# Patient Record
Sex: Male | Born: 1970 | Race: White | Hispanic: No | Marital: Single | Smoking: Current some day smoker
Health system: Southern US, Community
[De-identification: ages and names within clinical notes are randomized; demographics above are authoritative.]

## PROBLEM LIST (undated history)

## (undated) DIAGNOSIS — E7800 Pure hypercholesterolemia, unspecified: Secondary | ICD-10-CM

## (undated) DIAGNOSIS — F419 Anxiety disorder, unspecified: Secondary | ICD-10-CM

## (undated) DIAGNOSIS — E119 Type 2 diabetes mellitus without complications: Secondary | ICD-10-CM

---

## 2019-06-06 DIAGNOSIS — Z23 Encounter for immunization: Secondary | ICD-10-CM | POA: Diagnosis not present

## 2019-07-05 DIAGNOSIS — Z23 Encounter for immunization: Secondary | ICD-10-CM | POA: Diagnosis not present

## 2019-09-02 DIAGNOSIS — M9902 Segmental and somatic dysfunction of thoracic region: Secondary | ICD-10-CM | POA: Diagnosis not present

## 2019-09-02 DIAGNOSIS — M546 Pain in thoracic spine: Secondary | ICD-10-CM | POA: Diagnosis not present

## 2019-09-02 DIAGNOSIS — M5413 Radiculopathy, cervicothoracic region: Secondary | ICD-10-CM | POA: Diagnosis not present

## 2019-09-02 DIAGNOSIS — M9901 Segmental and somatic dysfunction of cervical region: Secondary | ICD-10-CM | POA: Diagnosis not present

## 2019-10-23 DIAGNOSIS — E782 Mixed hyperlipidemia: Secondary | ICD-10-CM | POA: Diagnosis not present

## 2019-10-23 DIAGNOSIS — E118 Type 2 diabetes mellitus with unspecified complications: Secondary | ICD-10-CM | POA: Diagnosis not present

## 2019-10-23 DIAGNOSIS — F411 Generalized anxiety disorder: Secondary | ICD-10-CM | POA: Diagnosis not present

## 2019-10-23 DIAGNOSIS — Z0189 Encounter for other specified special examinations: Secondary | ICD-10-CM | POA: Diagnosis not present

## 2019-12-13 DIAGNOSIS — R7301 Impaired fasting glucose: Secondary | ICD-10-CM | POA: Diagnosis not present

## 2019-12-13 DIAGNOSIS — Z125 Encounter for screening for malignant neoplasm of prostate: Secondary | ICD-10-CM | POA: Diagnosis not present

## 2019-12-13 DIAGNOSIS — Z1329 Encounter for screening for other suspected endocrine disorder: Secondary | ICD-10-CM | POA: Diagnosis not present

## 2019-12-13 DIAGNOSIS — Z0000 Encounter for general adult medical examination without abnormal findings: Secondary | ICD-10-CM | POA: Diagnosis not present

## 2019-12-19 DIAGNOSIS — Z0001 Encounter for general adult medical examination with abnormal findings: Secondary | ICD-10-CM | POA: Diagnosis not present

## 2019-12-19 DIAGNOSIS — E782 Mixed hyperlipidemia: Secondary | ICD-10-CM | POA: Diagnosis not present

## 2019-12-19 DIAGNOSIS — F411 Generalized anxiety disorder: Secondary | ICD-10-CM | POA: Diagnosis not present

## 2019-12-19 DIAGNOSIS — E118 Type 2 diabetes mellitus with unspecified complications: Secondary | ICD-10-CM | POA: Diagnosis not present

## 2020-01-28 ENCOUNTER — Other Ambulatory Visit: Payer: Self-pay

## 2020-01-28 ENCOUNTER — Other Ambulatory Visit: Payer: Self-pay | Admitting: *Deleted

## 2020-01-28 DIAGNOSIS — Z20822 Contact with and (suspected) exposure to covid-19: Secondary | ICD-10-CM

## 2020-01-30 LAB — SPECIMEN STATUS REPORT

## 2020-01-30 LAB — NOVEL CORONAVIRUS, NAA: SARS-CoV-2, NAA: NOT DETECTED

## 2020-01-30 LAB — SARS-COV-2, NAA 2 DAY TAT

## 2020-10-06 DIAGNOSIS — Z206 Contact with and (suspected) exposure to human immunodeficiency virus [HIV]: Secondary | ICD-10-CM | POA: Diagnosis not present

## 2020-10-06 DIAGNOSIS — Z202 Contact with and (suspected) exposure to infections with a predominantly sexual mode of transmission: Secondary | ICD-10-CM | POA: Diagnosis not present

## 2020-10-06 DIAGNOSIS — Z114 Encounter for screening for human immunodeficiency virus [HIV]: Secondary | ICD-10-CM | POA: Diagnosis not present

## 2021-03-06 ENCOUNTER — Other Ambulatory Visit: Payer: Self-pay

## 2021-03-06 ENCOUNTER — Ambulatory Visit
Admission: EM | Admit: 2021-03-06 | Discharge: 2021-03-06 | Disposition: A | Discharge status: Home / Self Care | Payer: 59

## 2021-03-06 DIAGNOSIS — M778 Other enthesopathies, not elsewhere classified: Secondary | ICD-10-CM | POA: Diagnosis not present

## 2021-03-06 DIAGNOSIS — L02412 Cutaneous abscess of left axilla: Secondary | ICD-10-CM

## 2021-03-06 HISTORY — DX: Pure hypercholesterolemia, unspecified: E78.00

## 2021-03-06 HISTORY — DX: Anxiety disorder, unspecified: F41.9

## 2021-03-06 HISTORY — DX: Type 2 diabetes mellitus without complications: E11.9

## 2021-03-06 MED ORDER — KETOROLAC TROMETHAMINE 60 MG/2ML IM SOLN
60.0000 mg | Freq: Once | INTRAMUSCULAR | Status: AC
  Administered 2021-03-06: 60 mg via INTRAMUSCULAR

## 2021-03-06 MED ORDER — MUPIROCIN 2 % EX OINT: 1.0000 "application " | TOPICAL_OINTMENT | Freq: Two times a day (BID) | CUTANEOUS | 0 refills | Status: DC

## 2021-03-06 MED ORDER — SULFAMETHOXAZOLE-TRIMETHOPRIM 800-160 MG PO TABS: 1.0000 | ORAL_TABLET | Freq: Two times a day (BID) | ORAL | 0 refills | Status: DC

## 2021-03-06 MED ORDER — HIBICLENS 4 % EX LIQD: Freq: Every day | CUTANEOUS | 0 refills | Status: DC | PRN

## 2021-03-06 NOTE — ED Triage Notes (Signed)
Pt presents with abscess under left arm x1 week and rt elbow pain that radiates to his wrist

## 2021-03-06 NOTE — ED Provider Notes (Signed)
[facility]    CSN: [id number] Arrival date & time: 03/06/21  0836      History   Chief Complaint Chief Complaint  Patient presents with   Abscess    HPI James Brock is a 50 y.o. male.   Patient presenting today with 1 week history of an itchy bug bite type area under left arm that he has been scratching and he noticed the last few days it has become red, swollen, very painful and draining.  He denies fever, chills, body aches.  Has not taken anything over-the-counter for symptoms thus far.  He is also having right lateral elbow pain with certain movements that radiates down into his forearm.  He has a history of tennis elbow and states this feels similar.  He denies any obvious injury, swelling, redness, numbness, tingling.  Not tried anything for this either.  Past Medical History:  Diagnosis Date   Anxiety    Diabetes mellitus without complication (HCC)    High cholesterol     There are no problems to display for this patient.   History reviewed. No pertinent surgical history.   Home Medications    Prior to Admission medications   Medication Sig Start Date End Date Taking? Authorizing Provider  ALPRAZolam Prudy Feeler) 0.5 MG tablet Take 0.5 mg by mouth at bedtime as needed for anxiety.   Yes [provider]  buPROPion (WELLBUTRIN XL) 150 MG 24 hr tablet Take 150 mg by mouth daily.   Yes [provider]  chlorhexidine (HIBICLENS) 4 % external liquid Apply topically daily as needed. 03/06/21  Yes Particia Nearing, PA-C  GLYBURIDE-METFORMIN PO Take by mouth.   Yes [provider]  mupirocin ointment (BACTROBAN) 2 % Apply 1 application topically 2 (two) times daily. 03/06/21  Yes Particia Nearing, PA-C  sulfamethoxazole-trimethoprim (BACTRIM DS) 800-160 MG tablet Take 1 tablet by mouth 2 (two) times daily. 03/06/21  Yes Particia Nearing, PA-C   Family History History reviewed. No pertinent family history.  Social  History Social History   Tobacco Use   Smoking status: Never   Smokeless tobacco: Never    Allergies   Patient has no known allergies.  Review of Systems Review of Systems PER HPI   Physical Exam Triage Vital Signs ED Triage Vitals  Enc Vitals Group     BP 03/06/21 0912 (!) 145/83     Pulse Rate 03/06/21 0912 70     Resp 03/06/21 0912 14     Temp 03/06/21 0912 98.6 F (37 C)     Temp Source 03/06/21 0912 Oral     SpO2 03/06/21 0912 95 %     Weight --      Height --      Head Circumference --      Peak Flow --      Pain Score 03/06/21 0913 6     Pain Loc --      Pain Edu? --      Excl. in GC? --    No data found.  Updated Vital Signs BP (!) 145/83 (BP Location: Right Arm)    Pulse 70    Temp 98.6 F (37 C) (Oral)    Resp 14    SpO2 95%   Visual Acuity Right Eye Distance:   Left Eye Distance:   Bilateral Distance:    Right Eye Near:   Left Eye Near:    Bilateral Near:     Physical Exam Vitals and nursing note  reviewed.  Constitutional:      Appearance: Normal appearance.  HENT:     Head: Atraumatic.     Mouth/Throat:     Mouth: Mucous membranes are moist.  Eyes:     Extraocular Movements: Extraocular movements intact.     Conjunctiva/sclera: Conjunctivae normal.  Cardiovascular:     Rate and Rhythm: Normal rate and regular rhythm.  Pulmonary:     Effort: Pulmonary effort is normal.     Breath sounds: Normal breath sounds.  Musculoskeletal:        General: Tenderness present. No swelling, deformity or signs of injury. Normal range of motion.     Cervical back: Normal range of motion and neck supple.     Comments: Tender to palpation over lateral right elbow, worse with twisting of the forearm and gripping motion  Skin:    General: Skin is warm.     Comments: Erythematous, edematous abscess with scabbing and active drainage present in left axilla.  Significantly tender to palpation.  Neurological:     General: No focal deficit present.      Mental Status: He is oriented to person, place, and time.     Motor: No weakness.     Gait: Gait normal.  Psychiatric:        Mood and Affect: Mood normal.        Thought Content: Thought content normal.        Judgment: Judgment normal.     UC Treatments / Results  Labs (all labs ordered are listed, but only abnormal results are displayed) Labs Reviewed - No data to display  EKG   Radiology No results found.  Procedures Procedures (including critical care time)  Medications Ordered in UC Medications  ketorolac (TORADOL) injection 60 mg (60 mg Intramuscular Given 03/06/21 1052)    Initial Impression / Assessment and Plan / UC Course  I have reviewed the triage vital signs and the nursing notes.  Pertinent labs & imaging results that were available during my care of the patient were reviewed by me and considered in my medical decision making (see chart for details).     Patient declines I&D procedure to further drain the abscess as it is already spontaneously draining in some areas and requesting warm compresses, antibiotics, Hibiclens, mupirocin instead to see if this resolves the issue.  We will treat with IM Toradol for pain from this area as well as for tendinitis of right elbow.  Discussed over-the-counter supportive medications, home care, tennis elbow bracing and rest.  Return for acutely worsening symptoms.  Final Clinical Impressions(s) / UC Diagnoses   Final diagnoses:  Abscess of left axilla  Right elbow tendonitis   Discharge Instructions   None    ED Prescriptions     Medication Sig Dispense Auth. Provider   sulfamethoxazole-trimethoprim (BACTRIM DS) 800-160 MG tablet Take 1 tablet by mouth 2 (two) times daily. 20 tablet Particia Nearing, [place]   chlorhexidine (HIBICLENS) 4 % external liquid Apply topically daily as needed. 120 mL Particia Nearing, PA-C   mupirocin ointment (BACTROBAN) 2 % Apply 1 application topically 2 (two) times daily.  22 g Particia Nearing, [place]      PDMP not reviewed this encounter.   Particia Nearing, [place] 03/06/21 1057

## 2021-04-26 ENCOUNTER — Other Ambulatory Visit (HOSPITAL_COMMUNITY): Payer: Self-pay | Admitting: Family Medicine

## 2021-04-26 ENCOUNTER — Ambulatory Visit (HOSPITAL_COMMUNITY)
Admission: RE | Admit: 2021-04-26 | Discharge: 2021-04-26 | Disposition: A | Discharge status: Home / Self Care | Payer: 59 | Source: Ambulatory Visit | Attending: Family Medicine | Admitting: Family Medicine

## 2021-04-26 ENCOUNTER — Other Ambulatory Visit: Payer: Self-pay

## 2021-04-26 DIAGNOSIS — M25512 Pain in left shoulder: Secondary | ICD-10-CM | POA: Insufficient documentation

## 2021-11-01 ENCOUNTER — Encounter (INDEPENDENT_AMBULATORY_CARE_PROVIDER_SITE_OTHER): Payer: Self-pay | Admitting: *Deleted

## 2022-04-22 ENCOUNTER — Encounter (INDEPENDENT_AMBULATORY_CARE_PROVIDER_SITE_OTHER): Payer: Self-pay | Admitting: *Deleted

## 2022-07-11 ENCOUNTER — Ambulatory Visit
Admission: EM | Admit: 2022-07-11 | Discharge: 2022-07-11 | Disposition: A | Discharge status: Home / Self Care | Payer: Commercial Managed Care - PPO | Attending: Nurse Practitioner | Admitting: Nurse Practitioner

## 2022-07-11 DIAGNOSIS — H66003 Acute suppurative otitis media without spontaneous rupture of ear drum, bilateral: Secondary | ICD-10-CM | POA: Insufficient documentation

## 2022-07-11 DIAGNOSIS — Z1152 Encounter for screening for COVID-19: Secondary | ICD-10-CM | POA: Diagnosis not present

## 2022-07-11 DIAGNOSIS — U071 COVID-19: Secondary | ICD-10-CM | POA: Insufficient documentation

## 2022-07-11 MED ORDER — AMOXICILLIN 875 MG PO TABS: 875.0000 mg | ORAL_TABLET | Freq: Two times a day (BID) | ORAL | 0 refills | Status: AC

## 2022-07-11 MED ORDER — BENZONATATE 100 MG PO CAPS
100.0000 mg | ORAL_CAPSULE | Freq: Three times a day (TID) | ORAL | 0 refills | Status: DC | PRN
Start: 2022-07-11 — End: 2023-10-18

## 2022-07-11 NOTE — ED Provider Notes (Signed)
[facility]    CSN: [id number] Arrival date & time: 07/11/22  0919      History   Chief Complaint Chief Complaint  Patient presents with   Headache    I have tested Covid last Thuesday and still carry  symptoms - Entered by patient   Cough    HPI James Brock is a 52 y.o. male.   Patient presents today for 4-day history of tactile fevers, body aches and chills, congested cough, shortness of breath, runny and stuffy nose, sneezing, sharp pain in his back after sneezing, sore throat, headache, decreased appetite, and fatigue.  Patient denies wheezing, chest pain, chest congestion, abdominal pain, nausea/vomiting, diarrhea, and ear pain.  No loss of taste or smell.  Has been taking Tylenol and Mucinex DM without much improvement in symptoms.  Reports he took an at-home COVID-19 test that was +4 days ago.    Past Medical History:  Diagnosis Date   Anxiety    Diabetes mellitus without complication    High cholesterol     There are no problems to display for this patient.   History reviewed. No pertinent surgical history.     Home Medications    Prior to Admission medications   Medication Sig Start Date End Date Taking? Authorizing Provider  ALPRAZolam Prudy Feeler) 0.5 MG tablet Take 0.5 mg by mouth at bedtime as needed for anxiety.   Yes [provider]  amoxicillin (AMOXIL) 875 MG tablet Take 1 tablet (875 mg total) by mouth 2 (two) times daily for 5 days. 07/11/22 07/16/22 Yes Valentino Nose, NP  benzonatate (TESSALON) 100 MG capsule Take 1 capsule (100 mg total) by mouth 3 (three) times daily as needed for cough. Do not take with alcohol or while driving or operating heavy machinery.  May cause drowsiness. 07/11/22  Yes Valentino Nose, NP  DULoxetine (CYMBALTA) 60 MG capsule Take 60 mg by mouth daily. 05/06/22  Yes [provider]  fexofenadine (ALLEGRA) 180 MG tablet Take 180 mg by mouth daily.   Yes [provider]   GLYBURIDE-METFORMIN PO Take by mouth.   Yes [provider]  OZEMPIC, 2 MG/DOSE, 8 MG/3ML SOPN Inject into the skin.   Yes [provider]    Family History History reviewed. No pertinent family history.  Social History Social History   Tobacco Use   Smoking status: Never   Smokeless tobacco: Never  Substance Use Topics   Alcohol use: Never   Drug use: Never     Allergies   Patient has no known allergies.   Review of Systems Review of Systems Per HPI  Physical Exam Triage Vital Signs ED Triage Vitals  Enc Vitals Group     BP 07/11/22 1014 (!) 134/94     Pulse Rate 07/11/22 1014 87     Resp 07/11/22 1014 16     Temp 07/11/22 1014 98.7 F (37.1 C)     Temp Source 07/11/22 1014 Oral     SpO2 07/11/22 1014 96 %     Weight --      Height --      Head Circumference --      Peak Flow --      Pain Score 07/11/22 1015 5     Pain Loc --      Pain Edu? --      Excl. in GC? --    No data found.  Updated Vital Signs BP (!) 134/94 (BP Location: Right Arm)  Pulse 87   Temp 98.7 F (37.1 C) (Oral)   Resp 16   SpO2 96%   Visual Acuity Right Eye Distance:   Left Eye Distance:   Bilateral Distance:    Right Eye Near:   Left Eye Near:    Bilateral Near:     Physical Exam Vitals and nursing note reviewed.  Constitutional:      General: He is not in acute distress.    Appearance: Normal appearance. He is not ill-appearing or toxic-appearing.  HENT:     Head: Normocephalic and atraumatic.     Right Ear: Ear canal and external ear normal. Tympanic membrane is erythematous.     Left Ear: Ear canal and external ear normal. Tympanic membrane is erythematous.     Nose: Congestion and rhinorrhea present.     Mouth/Throat:     Mouth: Mucous membranes are moist.     Pharynx: Oropharynx is clear. Posterior oropharyngeal erythema present. No oropharyngeal exudate.  Eyes:     General: No scleral icterus.    Extraocular Movements: Extraocular  movements intact.  Cardiovascular:     Rate and Rhythm: Normal rate and regular rhythm.  Pulmonary:     Effort: Pulmonary effort is normal. No respiratory distress.     Breath sounds: Normal breath sounds. No wheezing, rhonchi or rales.  Abdominal:     General: Abdomen is flat.     Palpations: Abdomen is soft.  Musculoskeletal:     Cervical back: Normal range of motion and neck supple.  Lymphadenopathy:     Cervical: No cervical adenopathy.  Skin:    General: Skin is warm and dry.     Coloration: Skin is not jaundiced or pale.     Findings: No erythema or rash.  Neurological:     Mental Status: He is alert and oriented to person, place, and time.     Motor: No weakness.  Psychiatric:        Behavior: Behavior is cooperative.      UC Treatments / Results  Labs (all labs ordered are listed, but only abnormal results are displayed) Labs Reviewed  SARS CORONAVIRUS 2 (TAT 6-24 HRS)    EKG   Radiology No results found.  Procedures Procedures (including critical care time)  Medications Ordered in UC Medications - No data to display  Initial Impression / Assessment and Plan / UC Course  I have reviewed the triage vital signs and the nursing notes.  Pertinent labs & imaging results that were available during my care of the patient were reviewed by me and considered in my medical decision making (see chart for details).   Patient is well-appearing, afebrile, not tachycardic, not tachypneic, oxygenating well on room air.  Patient is mildly hypertensive in triage.  1. COVID-19 2. Encounter for screening for COVID-19 Discussed with patient that symptoms are consistent with COVID-19 Vital signs and examination are reassuring today Supportive care discussed with patient-start cough suppressant medication Note given for work Patient requests repeat testing for work-this was completed, but will not change treatment at this time and discussed with patient  3. Non-recurrent  acute suppurative otitis media of both ears without spontaneous rupture of tympanic membranes Treat with amoxicillin twice daily for 5 days Supportive care discussed  The patient was given the opportunity to ask questions.  All questions answered to their satisfaction.  The patient is in agreement to this plan.   Final Clinical Impressions(s) / UC Diagnoses   Final diagnoses:  COVID-19  Encounter  for screening for COVID-19  Non-recurrent acute suppurative otitis media of both ears without spontaneous rupture of tympanic membranes     Discharge Instructions      You have a viral upper respiratory infection.  You also have an ear infection.  Take amoxicillin twice daily for 5 day to treat the ear infection.  Symptoms should improve over the next week to 10 days.  If you develop chest pain or shortness of breath, go to the emergency room.  We have tested you today for COVID-19.  You will see the results in Mychart and we will call you with positive results.   Some things that can make you feel better are: - Increased rest - Increasing fluid with water/sugar free electrolytes - Acetaminophen and ibuprofen as needed for fever/pain - Salt water gargling, chloraseptic spray and throat lozenges - OTC guaifenesin (Mucinex) 600 mg twice daily - Saline sinus flushes or a neti pot - Humidifying the air -Tessalon Perles during the day as needed for dry cough     ED Prescriptions     Medication Sig Dispense Auth. Provider   benzonatate (TESSALON) 100 MG capsule Take 1 capsule (100 mg total) by mouth 3 (three) times daily as needed for cough. Do not take with alcohol or while driving or operating heavy machinery.  May cause drowsiness. 21 capsule Cathlean Marseilles A, NP   amoxicillin (AMOXIL) 875 MG tablet Take 1 tablet (875 mg total) by mouth 2 (two) times daily for 5 days. 10 tablet Valentino Nose, NP      PDMP not reviewed this encounter.   Valentino Nose, NP 07/11/22  1115

## 2022-07-11 NOTE — ED Triage Notes (Signed)
Pt states he tested positive for Covid on home test on Thursday. Now having headache, body aches, cough, chills. Taking tylenol,and mucinx with no relief of symptoms.

## 2022-07-11 NOTE — Discharge Instructions (Signed)
You have a viral upper respiratory infection.  You also have an ear infection.  Take amoxicillin twice daily for 5 day to treat the ear infection.  Symptoms should improve over the next week to 10 days.  If you develop chest pain or shortness of breath, go to the emergency room.  We have tested you today for COVID-19.  You will see the results in Mychart and we will call you with positive results.   Some things that can make you feel better are: - Increased rest - Increasing fluid with water/sugar free electrolytes - Acetaminophen and ibuprofen as needed for fever/pain - Salt water gargling, chloraseptic spray and throat lozenges - OTC guaifenesin (Mucinex) 600 mg twice daily - Saline sinus flushes or a neti pot - Humidifying the air -Tessalon Perles during the day as needed for dry cough

## 2022-07-12 LAB — SARS CORONAVIRUS 2 (TAT 6-24 HRS): SARS Coronavirus 2: POSITIVE — AB

## 2022-07-19 ENCOUNTER — Encounter (INDEPENDENT_AMBULATORY_CARE_PROVIDER_SITE_OTHER): Payer: Self-pay | Admitting: *Deleted

## 2022-12-20 IMAGING — DX DG SHOULDER 2+V*L*
3 series · 3 of 3 positions shown · non-contrast
Comparison: None.

CLINICAL DATA: Left shoulder pain with radiation into left side of
neck x 4-5 weeks.

EXAM:
LEFT SHOULDER - 2+ VIEW

[shoulder grashey]
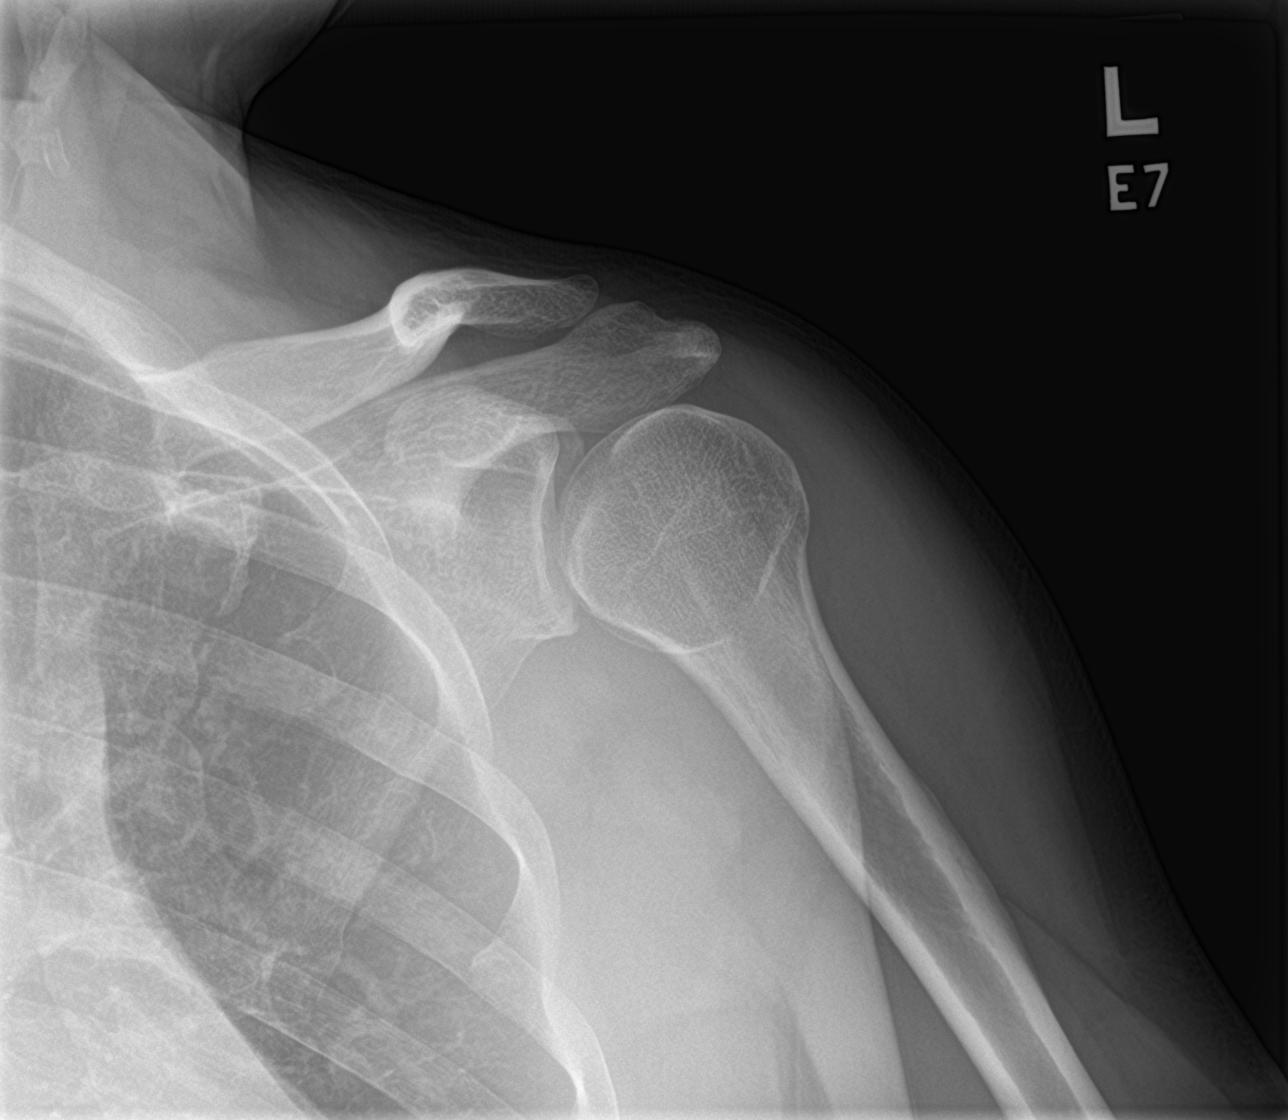

[shoulder y view]
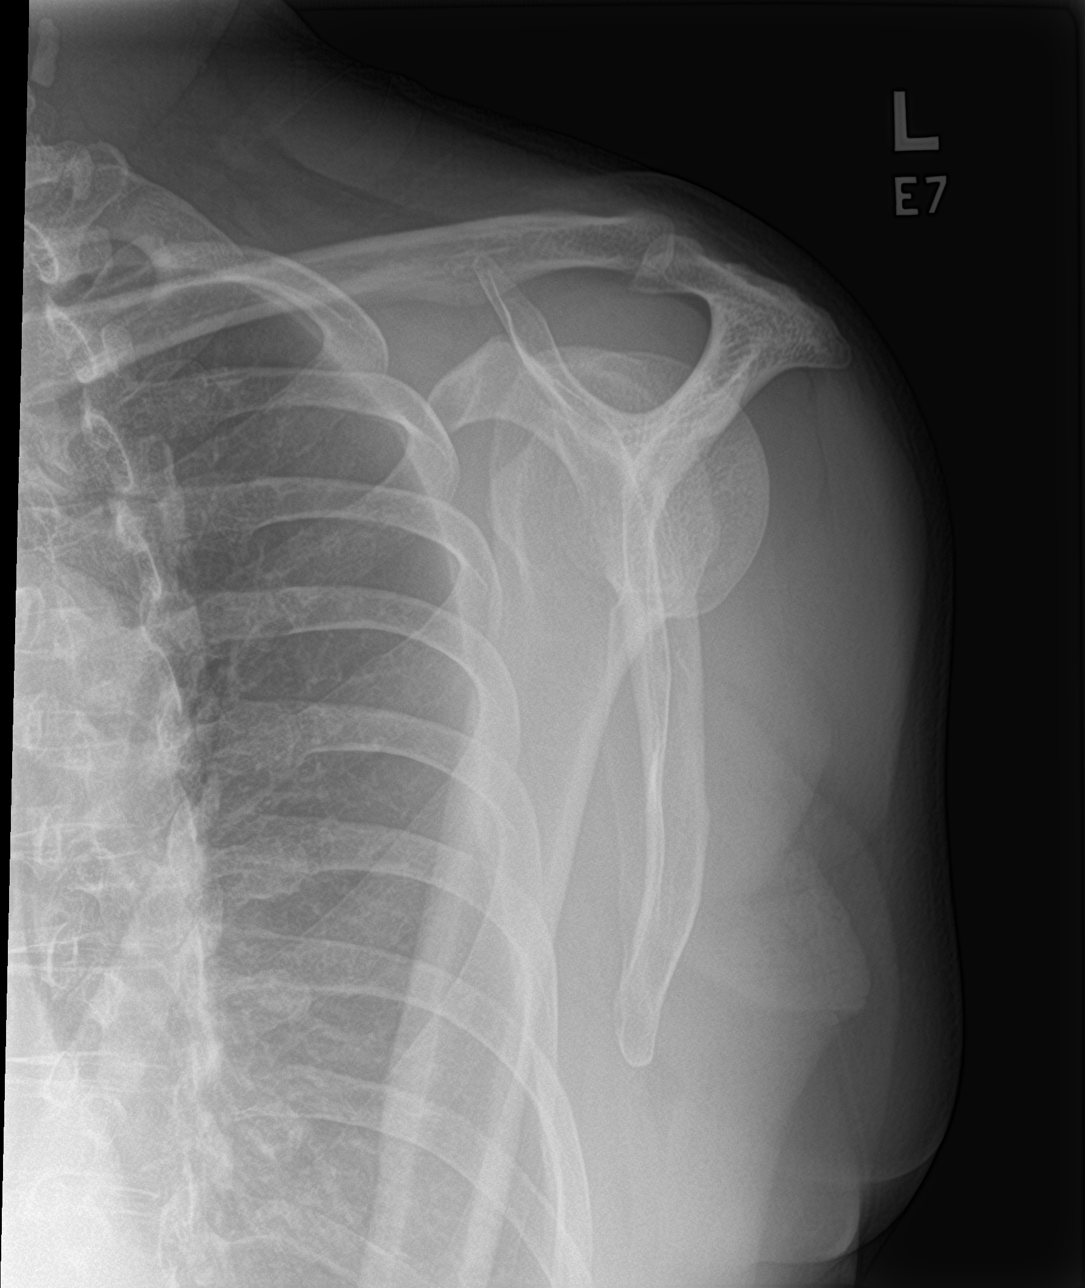

[shoulder axillary]
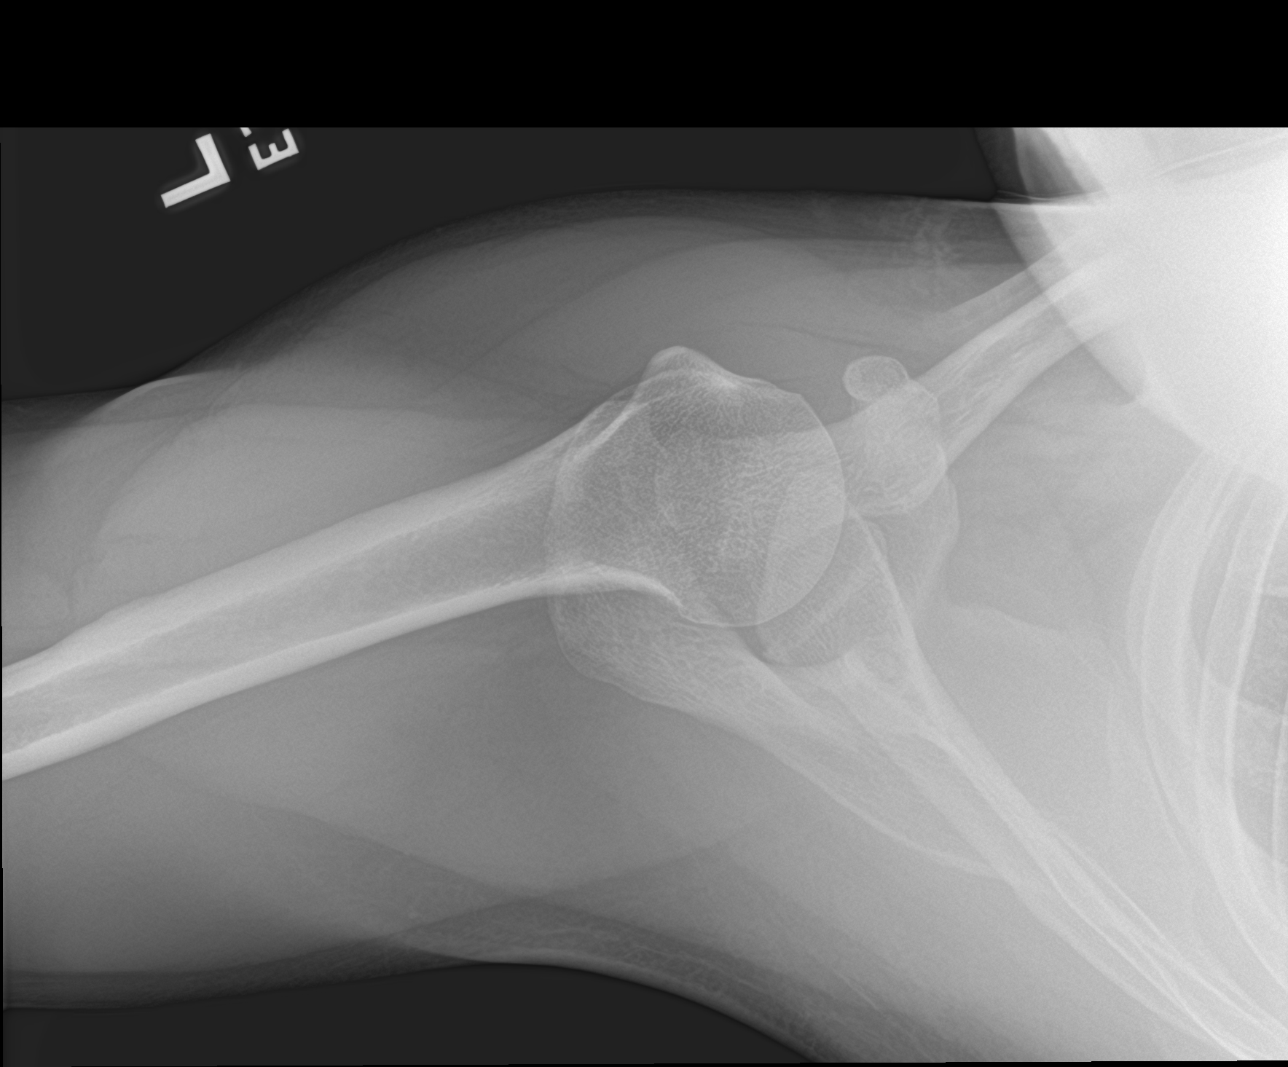

[3 of 3 positions shown; findings below may reference images not displayed]

FINDINGS: Mild glenohumeral joint space narrowing and inferior degenerative
osteophytosis. The acromioclavicular joint space is appropriately
aligned and maintained. No acute fracture or dislocation. The
visualized portion of the left lung is unremarkable.
IMPRESSION: Mild glenohumeral osteoarthritis.

## 2022-12-22 ENCOUNTER — Encounter (INDEPENDENT_AMBULATORY_CARE_PROVIDER_SITE_OTHER): Payer: Self-pay | Admitting: *Deleted

## 2023-05-08 ENCOUNTER — Other Ambulatory Visit (HOSPITAL_COMMUNITY): Payer: Self-pay | Admitting: Family Medicine

## 2023-05-08 DIAGNOSIS — R748 Abnormal levels of other serum enzymes: Secondary | ICD-10-CM

## 2023-10-18 ENCOUNTER — Ambulatory Visit
Admission: RE | Admit: 2023-10-18 | Discharge: 2023-10-18 | Disposition: A | Discharge status: Home / Self Care | Attending: Physician Assistant | Admitting: Physician Assistant

## 2023-10-18 ENCOUNTER — Other Ambulatory Visit: Payer: Self-pay

## 2023-10-18 VITALS — BP 125/86 | HR 88 | Temp 98.4°F | Resp 18 | Ht 64.0 in | Wt 175.0 lb

## 2023-10-18 DIAGNOSIS — J9801 Acute bronchospasm: Secondary | ICD-10-CM | POA: Diagnosis not present

## 2023-10-18 DIAGNOSIS — R051 Acute cough: Secondary | ICD-10-CM

## 2023-10-18 DIAGNOSIS — J029 Acute pharyngitis, unspecified: Secondary | ICD-10-CM

## 2023-10-18 MED ORDER — ALBUTEROL SULFATE HFA 108 (90 BASE) MCG/ACT IN AERS: 1.0000 | INHALATION_SPRAY | Freq: Four times a day (QID) | RESPIRATORY_TRACT | 0 refills | Status: AC | PRN

## 2023-10-18 MED ORDER — BENZONATATE 100 MG PO CAPS: 100.0000 mg | ORAL_CAPSULE | Freq: Three times a day (TID) | ORAL | 0 refills | Status: DC

## 2023-10-18 MED ORDER — IPRATROPIUM-ALBUTEROL 0.5-2.5 (3) MG/3ML IN SOLN
3.0000 mL | Freq: Once | RESPIRATORY_TRACT | Status: AC
  Administered 2023-10-18: 3 mL via RESPIRATORY_TRACT

## 2023-10-18 NOTE — ED Provider Notes (Signed)
 [facility]    CSN: [id number] Arrival date & time: 10/18/23  9081      History   Chief Complaint Chief Complaint  Patient presents with   Cough    Entered by patient    HPI James Brock is a 53 y.o. male.   HPI Pt reports having coughing, sore throat He states he was cleaning his home a few days ago - home did not have AV or heat going and there was a lot of mold and mildew present He reports that the coughing seems worse while he is active and talking. He reports cough is productive and he has rhinorrhea  He has not been taking anything for symptoms  He denies similar symptoms in other members of household    Past Medical History:  Diagnosis Date   Anxiety    Diabetes mellitus without complication (HCC)    High cholesterol     There are no active problems to display for this patient.   History reviewed. No pertinent surgical history.     Home Medications    Prior to Admission medications   Medication Sig Start Date End Date Taking? Authorizing Provider  albuterol  (VENTOLIN  HFA) 108 (90 Base) MCG/ACT inhaler Inhale 1-2 puffs into the lungs every 6 (six) hours as needed for wheezing or shortness of breath. 10/18/23  Yes Fermin Yan E, PA-C  benzonatate  (TESSALON ) 100 MG capsule Take 1 capsule (100 mg total) by mouth every 8 (eight) hours. 10/18/23  Yes Carlean Crowl E, PA-C  ALPRAZolam (XANAX) 0.5 MG tablet Take 0.5 mg by mouth at bedtime as needed for anxiety.    [provider]  DULoxetine (CYMBALTA) 60 MG capsule Take 60 mg by mouth daily. 05/06/22   [provider]  fexofenadine (ALLEGRA) 180 MG tablet Take 180 mg by mouth daily.    [provider]  GLYBURIDE-METFORMIN PO Take by mouth.    [provider]  OZEMPIC, 2 MG/DOSE, 8 MG/3ML SOPN Inject into the skin.    [provider]    Family History History reviewed. No pertinent family history.  Social History Social History   Tobacco Use   Smoking  status: Some Days    Types: Cigarettes   Smokeless tobacco: Never  Vaping Use   Vaping status: Never Used  Substance Use Topics   Alcohol use: Never   Drug use: Never     Allergies   Patient has no known allergies.   Review of Systems Review of Systems  Constitutional:  Positive for diaphoresis, fatigue and fever (subjective). Negative for chills.  HENT:  Positive for rhinorrhea (clear mucus) and sore throat. Negative for congestion, ear pain, sinus pressure and sinus pain.   Respiratory:  Positive for cough and shortness of breath. Negative for wheezing.   Gastrointestinal:  Negative for diarrhea, nausea and vomiting.  Musculoskeletal:  Positive for myalgias (unsure if this is from moving or illness).  Neurological:  Positive for headaches.     Physical Exam Triage Vital Signs ED Triage Vitals  Encounter Vitals Group     BP 10/18/23 0926 125/86     Girls Systolic BP Percentile --      Girls Diastolic BP Percentile --      Boys Systolic BP Percentile --      Boys Diastolic BP Percentile --      Pulse Rate 10/18/23 0926 88     Resp 10/18/23 0926 18     Temp 10/18/23 0926 98.4 F (36.9 C)  Temp Source 10/18/23 0926 Oral     SpO2 10/18/23 0926 96 %     Weight 10/18/23 0929 175 lb (79.4 kg)     Height 10/18/23 0929 5' 4 (1.626 m)     Head Circumference --      Peak Flow --      Pain Score 10/18/23 0928 7     Pain Loc --      Pain Education --      Exclude from Growth Chart --    No data found.  Updated Vital Signs BP 125/86 (BP Location: Right Arm)   Pulse 88   Temp 98.4 F (36.9 C) (Oral)   Resp 18   Ht 5' 4 (1.626 m)   Wt 175 lb (79.4 kg)   SpO2 96%   BMI 30.04 kg/m   Visual Acuity Right Eye Distance:   Left Eye Distance:   Bilateral Distance:    Right Eye Near:   Left Eye Near:    Bilateral Near:     Physical Exam Vitals reviewed.  Constitutional:      General: He is awake. He is not in acute distress.    Appearance: Normal appearance.  He is well-developed and well-groomed. He is not ill-appearing or toxic-appearing.     Comments: Pt is actively coughing during exam. Seated in exam chair, apparently overall comfortable without signs of acute distress or toxicity.   HENT:     Head: Normocephalic and atraumatic.     Right Ear: Hearing and ear canal normal. Tympanic membrane is erythematous.     Left Ear: Hearing and ear canal normal. Tympanic membrane is erythematous.     Mouth/Throat:     Lips: Pink.     Mouth: Mucous membranes are moist.     Pharynx: Oropharynx is clear. Uvula midline. Posterior oropharyngeal erythema and postnasal drip present. No pharyngeal swelling, oropharyngeal exudate or uvula swelling.     Tonsils: No tonsillar exudate or tonsillar abscesses. 0 on the right. 0 on the left.  Eyes:     General: Lids are normal. Gaze aligned appropriately.     Extraocular Movements: Extraocular movements intact.  Cardiovascular:     Rate and Rhythm: Normal rate and regular rhythm.     Heart sounds: Normal heart sounds.  Pulmonary:     Effort: Pulmonary effort is normal.     Breath sounds: Normal breath sounds. Decreased air movement present. No decreased breath sounds, wheezing, rhonchi or rales.  Musculoskeletal:     Cervical back: Normal range of motion and neck supple.  Lymphadenopathy:     Head:     Right side of head: No submental, submandibular or preauricular adenopathy.     Left side of head: No submental, submandibular or preauricular adenopathy.     Cervical:     Right cervical: No superficial cervical adenopathy.    Left cervical: No superficial cervical adenopathy.     Upper Body:     Right upper body: No supraclavicular adenopathy.     Left upper body: No supraclavicular adenopathy.  Skin:    General: Skin is warm and dry.  Neurological:     General: No focal deficit present.     Mental Status: He is alert and oriented to person, place, and time.  Psychiatric:        Mood and Affect: Mood  normal.        Behavior: Behavior normal. Behavior is cooperative.        Thought Content: Thought content  normal.        Judgment: Judgment normal.      UC Treatments / Results  Labs (all labs ordered are listed, but only abnormal results are displayed) Labs Reviewed - No data to display  EKG   Radiology No results found.  Procedures Procedures (including critical care time)  Medications Ordered in UC Medications  ipratropium-albuterol  (DUONEB) 0.5-2.5 (3) MG/3ML nebulizer solution 3 mL (3 mLs Nebulization Given 10/18/23 0951)    Initial Impression / Assessment and Plan / UC Course  I have reviewed the triage vital signs and the nursing notes.  Pertinent labs & imaging results that were available during my care of the patient were reviewed by me and considered in my medical decision making (see chart for details).      Final Clinical Impressions(s) / UC Diagnoses   Final diagnoses:  Acute cough  Sore throat  Cough due to bronchospasm   Pt presents today with concerns for acute coughing that is intermittently productive, sore throat, subjective fever and shortness of breath that has been ongoing for several days following recent clean up of his home that had mold and mildew. Physical exam is largely reassuring- mildly erythematous TM bilaterally and pharyngeal erythema present. Vitals are overall reassuring as well. Suspect cough due to bronchospasm as symptoms significantly improved following Duoneb breathing treatment in clinic. Will send in script for albuterol  inhaler and tessalon  pearls. Recommend further symptomatic relief with OTC medications as needed. Follow up as needed for persistent or progressing symptoms    Discharge Instructions      Based on your described symptoms and the duration of symptoms it is likely that you have a viral upper respiratory infection (often called a cold)  Symptoms can last for 3-10 days with lingering cough and intermittent  symptoms potentially  lasting several  weeks after that.  The goal of treatment at this time is to reduce your symptoms and discomfort   You can use the following medications and measures to help yourself feel better until your body fights this off: DayQuil/NyQuil, TheraFlu, Alka-Seltzer  (these medications typically have the same active ingredients in them so you can choose whichever one you prefer and take consistently during the day and night according to the manufactures instructions.) Flonase A daily antihistamine such as Zyrtec, Claritin, Allegra per your preference.  Please choose 1 and take consistently. Increased fluids.  It is recommended that you take in at least 64 ounces of water per day when you are not sick so it is important to increase this when you are sick and your body may be running fever. Rest Cough drops Chloraseptic throat spray to help with sore throat Nasal saline spray or nasal flushes to help with congestion and runny nose  I have also sent in an albuterol  inhaler for you to take to assist with your breathing/ coughing. This is typically what is called a rescue inhaler and you can use it when you have trouble breathing or can't stop coughing up to every 6 hours.  If you are having to use it more often than twice per day, every day for more than 2 weeks please let us  know as we may have to add a controller inhaler to your regimen until your symptoms reduce in severity.      If your symptoms seem like they are getting worse over the next 5 to 7 days or not improving you can always follow-up here in urgent care or go to your primary care  provider for further management. Go to the ER if you begin to have more serious symptoms such as shortness of breath, trouble breathing, loss of consciousness, swelling around the eyes, high fever, severe lasting headaches, vision changes or neck pain/stiffness.       ED Prescriptions     Medication Sig Dispense Auth. Provider    albuterol  (VENTOLIN  HFA) 108 (90 Base) MCG/ACT inhaler Inhale 1-2 puffs into the lungs every 6 (six) hours as needed for wheezing or shortness of breath. 8 g Mccormick Macon E, PA-C   benzonatate  (TESSALON ) 100 MG capsule Take 1 capsule (100 mg total) by mouth every 8 (eight) hours. 21 capsule Zailey Audia E, PA-C      PDMP not reviewed this encounter.   Taneisha Fuson, Rocky BRAVO, PA-C 10/18/23 1013

## 2023-10-18 NOTE — Discharge Instructions (Addendum)
 Based on your described symptoms and the duration of symptoms it is likely that you have a viral upper respiratory infection (often called a cold)  Symptoms can last for 3-10 days with lingering cough and intermittent symptoms potentially  lasting several  weeks after that.  The goal of treatment at this time is to reduce your symptoms and discomfort   You can use the following medications and measures to help yourself feel better until your body fights this off: DayQuil/NyQuil, TheraFlu, Alka-Seltzer  (these medications typically have the same active ingredients in them so you can choose whichever one you prefer and take consistently during the day and night according to the manufactures instructions.) Flonase A daily antihistamine such as Zyrtec, Claritin, Allegra per your preference.  Please choose 1 and take consistently. Increased fluids.  It is recommended that you take in at least 64 ounces of water per day when you are not sick so it is important to increase this when you are sick and your body may be running fever. Rest Cough drops Chloraseptic throat spray to help with sore throat Nasal saline spray or nasal flushes to help with congestion and runny nose  I have also sent in an albuterol  inhaler for you to take to assist with your breathing/ coughing. This is typically what is called a rescue inhaler and you can use it when you have trouble breathing or can't stop coughing up to every 6 hours.  If you are having to use it more often than twice per day, every day for more than 2 weeks please let us  know as we may have to add a controller inhaler to your regimen until your symptoms reduce in severity.      If your symptoms seem like they are getting worse over the next 5 to 7 days or not improving you can always follow-up here in urgent care or go to your primary care provider for further management. Go to the ER if you begin to have more serious symptoms such as shortness of  breath, trouble breathing, loss of consciousness, swelling around the eyes, high fever, severe lasting headaches, vision changes or neck pain/stiffness.

## 2023-10-18 NOTE — ED Triage Notes (Addendum)
 Pt presents with a chief complaint of productive cough x 5 days. States on Saturday he was cleaning out his new home for about 45 minutes and there was mold. Symptoms began shortly after. Cough is accompanied with a sore throat, SOB, and nasal congestion. Currently rates throat pain a 7/10. Believes it is due to his cough. Denies taking OTC medications for symptoms, unsure of what to take. 96% on room air in triage.

## 2023-10-20 ENCOUNTER — Ambulatory Visit: Payer: Self-pay | Admitting: Internal Medicine

## 2023-10-20 ENCOUNTER — Ambulatory Visit: Admitting: Radiology

## 2023-10-20 ENCOUNTER — Ambulatory Visit
Admission: RE | Admit: 2023-10-20 | Discharge: 2023-10-20 | Disposition: A | Discharge status: Home / Self Care | Source: Ambulatory Visit | Attending: Internal Medicine | Admitting: Internal Medicine

## 2023-10-20 VITALS — BP 139/92 | HR 89 | Temp 98.3°F | Resp 17

## 2023-10-20 DIAGNOSIS — J208 Acute bronchitis due to other specified organisms: Secondary | ICD-10-CM

## 2023-10-20 DIAGNOSIS — R0602 Shortness of breath: Secondary | ICD-10-CM | POA: Diagnosis not present

## 2023-10-20 DIAGNOSIS — B9689 Other specified bacterial agents as the cause of diseases classified elsewhere: Secondary | ICD-10-CM

## 2023-10-20 MED ORDER — GUAIFENESIN ER 1200 MG PO TB12: 1200.0000 mg | ORAL_TABLET | Freq: Two times a day (BID) | ORAL | 0 refills | Status: AC

## 2023-10-20 MED ORDER — DEXAMETHASONE SODIUM PHOSPHATE 10 MG/ML IJ SOLN
10.0000 mg | Freq: Once | INTRAMUSCULAR | Status: AC
  Administered 2023-10-20: 10 mg via INTRAMUSCULAR

## 2023-10-20 MED ORDER — IPRATROPIUM-ALBUTEROL 0.5-2.5 (3) MG/3ML IN SOLN
3.0000 mL | Freq: Once | RESPIRATORY_TRACT | Status: AC
  Administered 2023-10-20: 3 mL via RESPIRATORY_TRACT

## 2023-10-20 MED ORDER — AZITHROMYCIN 250 MG PO TABS: 250.0000 mg | ORAL_TABLET | Freq: Every day | ORAL | 0 refills | Status: DC

## 2023-10-20 NOTE — ED Triage Notes (Signed)
 Pt was seen on 7/30 for cough, congestion for 5 days. Returns today due to worsening cough and sob especially at night.  Pt has been using cough mediation and albuterol  inhaler prescribed at last visit.

## 2023-10-20 NOTE — ED Provider Notes (Signed)
 [facility]    CSN: [id number] Arrival date & time: 10/20/23  1125      History   Chief Complaint Chief Complaint  Patient presents with   Cough    Entered by patient    HPI Caliber Landess is a 53 y.o. male.   Tristyn Demarest is a 53 y.o. male presenting for chief complaint of Cough, congestion, and sore throat that started 7-8 days ago. He was seen at urgent care 2 days ago, diagnosed with bronchitis, and was given albuterol  inhaler/tessalon  perles for cough. Cough has worsened over the last 2 days despite use of prescribed medications. Reports increasing shortness of breath in the last 2-3 days. Denies recent fever, chills, n/v/d, abdominal pain, rash, and antibiotic/steroid use. Sometimes smokes cigarettes, denies history of asthma/copd. Taking OTC medications and using albuterol /tessalon  perles with minimal relief.   He is a type 2 diabetic and does not check sugars regularly. Last hemoglobin A1C performed in February 2025 was 10.2, he takes glyburide-metformin.    Cough   Past Medical History:  Diagnosis Date   Anxiety    Diabetes mellitus without complication (HCC)    High cholesterol     There are no active problems to display for this patient.   History reviewed. No pertinent surgical history.     Home Medications    Prior to Admission medications   Medication Sig Start Date End Date Taking? Authorizing Provider  azithromycin (ZITHROMAX) 250 MG tablet Take 1 tablet (250 mg total) by mouth daily. Take first 2 tablets together, then 1 every day until finished. 10/20/23  Yes Enedelia Dorna HERO, FNP  Guaifenesin 1200 MG TB12 Take 1 tablet (1,200 mg total) by mouth in the morning and at bedtime. 10/20/23  Yes Enedelia Dorna HERO, FNP  albuterol  (VENTOLIN  HFA) 108 (90 Base) MCG/ACT inhaler Inhale 1-2 puffs into the lungs every 6 (six) hours as needed for wheezing or shortness of breath. 10/18/23   Mecum, Erin E, PA-C  ALPRAZolam (XANAX) 0.5 MG tablet Take  0.5 mg by mouth at bedtime as needed for anxiety.    [provider]  benzonatate  (TESSALON ) 100 MG capsule Take 1 capsule (100 mg total) by mouth every 8 (eight) hours. 10/18/23   Mecum, Erin E, PA-C  DULoxetine (CYMBALTA) 60 MG capsule Take 60 mg by mouth daily. 05/06/22   [provider]  fexofenadine (ALLEGRA) 180 MG tablet Take 180 mg by mouth daily.    [provider]  GLYBURIDE-METFORMIN PO Take by mouth.    [provider]  OZEMPIC, 2 MG/DOSE, 8 MG/3ML SOPN Inject into the skin.    [provider]    Family History History reviewed. No pertinent family history.  Social History Social History   Tobacco Use   Smoking status: Some Days    Types: Cigarettes   Smokeless tobacco: Never  Vaping Use   Vaping status: Never Used  Substance Use Topics   Alcohol use: Never   Drug use: Never     Allergies   Patient has no known allergies.   Review of Systems Review of Systems  Respiratory:  Positive for cough.   Per HPI   Physical Exam Triage Vital Signs ED Triage Vitals  Encounter Vitals Group     BP 10/20/23 1206 (!) 139/92     Girls Systolic BP Percentile --      Girls Diastolic BP Percentile --      Boys Systolic BP Percentile --      Boys  Diastolic BP Percentile --      Pulse Rate 10/20/23 1206 89     Resp 10/20/23 1206 17     Temp 10/20/23 1206 98.3 F (36.8 C)     Temp Source 10/20/23 1205 Oral     SpO2 10/20/23 1206 96 %     Weight --      Height --      Head Circumference --      Peak Flow --      Pain Score 10/20/23 1209 4     Pain Loc --      Pain Education --      Exclude from Growth Chart --    No data found.  Updated Vital Signs BP (!) 139/92 (BP Location: Right Arm)   Pulse 89   Temp 98.3 F (36.8 C)   Resp 17   SpO2 96%   Visual Acuity Right Eye Distance:   Left Eye Distance:   Bilateral Distance:    Right Eye Near:   Left Eye Near:    Bilateral Near:     Physical Exam Vitals and  nursing note reviewed.  Constitutional:      Appearance: He is not ill-appearing or toxic-appearing.  HENT:     Head: Normocephalic and atraumatic.     Right Ear: Hearing, tympanic membrane, ear canal and external ear normal.     Left Ear: Hearing, tympanic membrane, ear canal and external ear normal.     Nose: Congestion present.     Mouth/Throat:     Lips: Pink.     Mouth: Mucous membranes are moist. No injury or oral lesions.     Dentition: Normal dentition.     Tongue: No lesions.     Pharynx: Oropharynx is clear. Uvula midline. No pharyngeal swelling, oropharyngeal exudate, posterior oropharyngeal erythema, uvula swelling or postnasal drip.     Tonsils: No tonsillar exudate.  Eyes:     General: Lids are normal. Vision grossly intact. Gaze aligned appropriately.     Extraocular Movements: Extraocular movements intact.     Conjunctiva/sclera: Conjunctivae normal.  Neck:     Trachea: Trachea and phonation normal.  Cardiovascular:     Rate and Rhythm: Normal rate and regular rhythm.     Heart sounds: Normal heart sounds, S1 normal and S2 normal.  Pulmonary:     Effort: Pulmonary effort is normal. No respiratory distress.     Breath sounds: Normal air entry. Wheezing (Expiratory wheezing heard to bilateral upper lung fields, course breath sounds throughout upper and lower.) present. No rhonchi or rales.     Comments: Speaking in full sentences without difficulty or increased respiratory effort. Harsh and dry sounding cough on exam with deep inspiration.  Chest:     Chest wall: No tenderness.  Musculoskeletal:     Cervical back: Neck supple.     Right lower leg: No edema.     Left lower leg: No edema.  Lymphadenopathy:     Cervical: No cervical adenopathy.  Skin:    General: Skin is warm and dry.     Capillary Refill: Capillary refill takes less than 2 seconds.     Findings: No rash.  Neurological:     General: No focal deficit present.     Mental Status: He is alert and  oriented to person, place, and time. Mental status is at baseline.     Cranial Nerves: No dysarthria or facial asymmetry.  Psychiatric:        Mood and  Affect: Mood normal.        Speech: Speech normal.        Behavior: Behavior normal.        Thought Content: Thought content normal.        Judgment: Judgment normal.      UC Treatments / Results  Labs (all labs ordered are listed, but only abnormal results are displayed) Labs Reviewed - No data to display  EKG   Radiology DG Chest 2 View Result Date: 10/20/2023 EXAM: 2 VIEW(S) XRAY OF THE CHEST 10/20/2023 01:32:42 PM COMPARISON: None available. CLINICAL HISTORY: Cough, congestion, shortness of breath for the last 7 days. FINDINGS: LUNGS AND PLEURA: No focal pulmonary opacity. No pulmonary edema. No pleural effusion. No pneumothorax. HEART AND MEDIASTINUM: No acute abnormality of the cardiac and mediastinal silhouettes. BONES AND SOFT TISSUES: No acute osseous abnormality. IMPRESSION: 1. No acute process. Electronically signed by: Katheleen Faes MD 10/20/2023 01:57 PM EDT RP Workstation: [id number]    Procedures Procedures (including critical care time)  Medications Ordered in UC Medications  dexamethasone (DECADRON) injection 10 mg (10 mg Intramuscular Given 10/20/23 1319)  ipratropium-albuterol  (DUONEB) 0.5-2.5 (3) MG/3ML nebulizer solution 3 mL (3 mLs Nebulization Given 10/20/23 1320)    Initial Impression / Assessment and Plan / UC Course  I have reviewed the triage vital signs and the nursing notes.  Pertinent labs & imaging results that were available during my care of the patient were reviewed by me and considered in my medical decision making (see chart for details).   1. Acute bacterial bronchitis, shortness of breath Evaluation suggests viral bronchitis, though given patient's history and risk for pneumonia, chest x-ray ordered. Chest x-ray negative for acute cardiopulmonary abnormality; however, I would like to cover for  atypical pathogens and reduce inflammation with use of azithromycin antibiotic.   Nebulizer treatment (duoneb) and dexamethasone 10mg  IM given in clinic with significant improvement in lung sounds and shortness of breath on reassessment.   Recent HA1C from Feb 2025 elevated, he does not check sugars at home, therefore oral prednisone burst is not prescribed. Dexamethasone IM should be sufficient.   Recommend treatment with steroid, bronchodilator, cough suppressants for symptomatic relief, and expectorants (mucinex) as needed- see AVS.     Counseled patient on potential for adverse effects with medications prescribed/recommended today, strict ER and return-to-clinic precautions discussed, patient verbalized understanding.    Final Clinical Impressions(s) / UC Diagnoses   Final diagnoses:  Shortness of breath  Acute bacterial bronchitis     Discharge Instructions      You have bronchitis which is inflammation of the upper airways in your lungs due to a virus.  I am concerned for possible atypical infection to the chest, therefore azithromycin antibiotic has been ordered.  Take 2 pills of azithromycin today, then 1 pill for the next 4 days until finished.  We gave you a shot of steroid in the clinic today, this might cause temporary increase in your sugar levels, sugar levels will return to normal. Steroid will help with your cough by reducing inflammation to the chest.  Use albuterol  every 4-6 hours as needed for cough, shortness of breath, and wheezing.   Use guaifenesin (plain mucinex) to break up congestion in nose/chest so that you are able to excrete easier. Drink plenty of fluids to stay well hydrated while taking mucinex so that it works well in the body.   If you develop any new or worsening symptoms or if your symptoms do not start to improve,  please return here or follow-up with your primary care provider. If your symptoms are severe, please go to the emergency room.      ED Prescriptions     Medication Sig Dispense Auth. Provider   azithromycin (ZITHROMAX) 250 MG tablet Take 1 tablet (250 mg total) by mouth daily. Take first 2 tablets together, then 1 every day until finished. 6 tablet Keron Koffman M, FNP   Guaifenesin 1200 MG TB12 Take 1 tablet (1,200 mg total) by mouth in the morning and at bedtime. 14 tablet Enedelia Dorna HERO, FNP      PDMP not reviewed this encounter.   Enedelia Dorna HERO, [place] 10/20/23 2159

## 2023-10-20 NOTE — Discharge Instructions (Signed)
 You have bronchitis which is inflammation of the upper airways in your lungs due to a virus.  I am concerned for possible atypical infection to the chest, therefore azithromycin antibiotic has been ordered.  Take 2 pills of azithromycin today, then 1 pill for the next 4 days until finished.  We gave you a shot of steroid in the clinic today, this might cause temporary increase in your sugar levels, sugar levels will return to normal. Steroid will help with your cough by reducing inflammation to the chest.  Use albuterol  every 4-6 hours as needed for cough, shortness of breath, and wheezing.   Use guaifenesin (plain mucinex) to break up congestion in nose/chest so that you are able to excrete easier. Drink plenty of fluids to stay well hydrated while taking mucinex so that it works well in the body.   If you develop any new or worsening symptoms or if your symptoms do not start to improve, please return here or follow-up with your primary care provider. If your symptoms are severe, please go to the emergency room.

## 2023-11-01 ENCOUNTER — Ambulatory Visit: Admission: RE | Admit: 2023-11-01 | Discharge: 2023-11-01 | Disposition: A | Discharge status: Home / Self Care

## 2023-11-01 ENCOUNTER — Other Ambulatory Visit: Payer: Self-pay

## 2023-11-01 VITALS — BP 125/85 | HR 82 | Temp 98.3°F | Resp 17 | Ht 64.0 in | Wt 175.0 lb

## 2023-11-01 DIAGNOSIS — R5383 Other fatigue: Secondary | ICD-10-CM | POA: Diagnosis not present

## 2023-11-01 DIAGNOSIS — R058 Other specified cough: Secondary | ICD-10-CM | POA: Diagnosis not present

## 2023-11-01 MED ORDER — AMOXICILLIN-POT CLAVULANATE 875-125 MG PO TABS: 1.0000 | ORAL_TABLET | Freq: Two times a day (BID) | ORAL | 0 refills | Status: DC

## 2023-11-01 MED ORDER — METHYLPREDNISOLONE 4 MG PO TBPK: ORAL_TABLET | ORAL | 0 refills | Status: DC

## 2023-11-01 NOTE — ED Triage Notes (Addendum)
 Pt presents with complaints of cough, fatigue, feeling cold then hot, sleepy, no energy, headaches, and muscle aches x 3 weeks. Currently rates overall pain a 4/10. OTC Tylenol taken as needed although not seeming to help symptoms. Partner has the same symptoms. Pt states I have been feeling faint-like the past two weeks. It is like my right leg wants to give out on me. I cannot really explain it.

## 2023-11-01 NOTE — ED Provider Notes (Signed)
 [facility]    CSN: [id number] Arrival date & time: 11/01/23  1047      History   Chief Complaint Chief Complaint  Patient presents with   Fatigue    Also coughing and feel hot - Entered by patient    HPI James Brock is a 53 y.o. male.   HPI  Pt is here for concerns of coughing. He states the coughing is getting better but his voice is hoarse He reports feeling feverish and chills along with muscle aches and fatigue. He states his right leg feels a bit weak like it may give out - happening intermittently He reports coughing is productive and he is having headaches He states a few people at work are having similar symptoms and have taken several weeks to fully recover He has been taking prescribed medications as directed and has finished them. He has also been taking Mucinex  and Tylenol PRN  He is using inhaler as needed and thinks this helps with coughing and breathing  He declines repeat CXR today  He is not sure what his blood glucose levels are running lately-states he does not check  Most recent appears to be 10.2% per labs from encounter on 09/12/23 ( chart review from apt on 05/08/23)   Patient was previously seen for persistent coughing on 10/18/2023 and 10/20/23.  On 10/18/2023 he was treated with DuoNeb breathing treatment and then provided with albuterol  rescue inhaler and benzonatate .  He was then seen again on 10/20/2023 and treated with Z-Pak and guaifenesin .  His chest x-ray from 10/20/2023 did not show signs of acute process.   Past Medical History:  Diagnosis Date   Anxiety    Diabetes mellitus without complication (HCC)    High cholesterol     There are no active problems to display for this patient.   History reviewed. No pertinent surgical history.     Home Medications    Prior to Admission medications   Medication Sig Start Date End Date Taking? Authorizing Provider  amoxicillin -clavulanate (AUGMENTIN ) 875-125 MG tablet Take 1 tablet by  mouth every 12 (twelve) hours. 11/01/23  Yes Bishop Vanderwerf E, PA-C  methylPREDNISolone  (MEDROL  DOSEPAK) 4 MG TBPK tablet Take per instructions on package. 11/01/23  Yes Kaylana Fenstermacher E, PA-C  albuterol  (VENTOLIN  HFA) 108 (90 Base) MCG/ACT inhaler Inhale 1-2 puffs into the lungs every 6 (six) hours as needed for wheezing or shortness of breath. 10/18/23   Dwane Andres E, PA-C  ALPRAZolam (XANAX) 0.5 MG tablet Take 0.5 mg by mouth at bedtime as needed for anxiety.    [provider]  atorvastatin (LIPITOR) 40 MG tablet Take 40 mg by mouth daily.    [provider]  azithromycin  (ZITHROMAX ) 250 MG tablet Take 1 tablet (250 mg total) by mouth daily. Take first 2 tablets together, then 1 every day until finished. 10/20/23   Enedelia Dorna HERO, FNP  benzonatate  (TESSALON ) 100 MG capsule Take 1 capsule (100 mg total) by mouth every 8 (eight) hours. 10/18/23   Breton Berns E, PA-C  busPIRone (BUSPAR) 10 MG tablet Take 10 mg by mouth 2 (two) times daily.    [provider]  DULoxetine (CYMBALTA) 60 MG capsule Take 60 mg by mouth daily. 05/06/22   [provider]  fexofenadine (ALLEGRA) 180 MG tablet Take 180 mg by mouth daily.    [provider]  GLYBURIDE-METFORMIN PO Take by mouth.    [provider]  Guaifenesin  1200 MG TB12 Take 1 tablet (1,200  mg total) by mouth in the morning and at bedtime. 10/20/23   Enedelia Dorna HERO, FNP  OZEMPIC, 2 MG/DOSE, 8 MG/3ML SOPN Inject into the skin.    [provider]    Family History History reviewed. No pertinent family history.  Social History Social History   Tobacco Use   Smoking status: Some Days    Types: Cigarettes   Smokeless tobacco: Never  Vaping Use   Vaping status: Never Used  Substance Use Topics   Alcohol use: Never   Drug use: Never     Allergies   Patient has no known allergies.   Review of Systems Review of Systems  Constitutional:  Positive for chills, diaphoresis, fatigue  and fever.  HENT:  Negative for congestion.   Respiratory:  Positive for cough and shortness of breath. Negative for chest tightness and wheezing.   Musculoskeletal:  Positive for myalgias.  Neurological:  Positive for weakness.     Physical Exam Triage Vital Signs ED Triage Vitals  Encounter Vitals Group     BP 11/01/23 1116 125/85     Girls Systolic BP Percentile --      Girls Diastolic BP Percentile --      Boys Systolic BP Percentile --      Boys Diastolic BP Percentile --      Pulse Rate 11/01/23 1116 82     Resp 11/01/23 1116 17     Temp 11/01/23 1116 98.3 F (36.8 C)     Temp Source 11/01/23 1116 Oral     SpO2 11/01/23 1116 97 %     Weight 11/01/23 1118 175 lb (79.4 kg)     Height 11/01/23 1118 5' 4 (1.626 m)     Head Circumference --      Peak Flow --      Pain Score 11/01/23 1117 4     Pain Loc --      Pain Education --      Exclude from Growth Chart --    No data found.  Updated Vital Signs BP 125/85 (BP Location: Right Arm)   Pulse 82   Temp 98.3 F (36.8 C) (Oral)   Resp 17   Ht 5' 4 (1.626 m)   Wt 175 lb (79.4 kg)   SpO2 97%   BMI 30.04 kg/m   Visual Acuity Right Eye Distance:   Left Eye Distance:   Bilateral Distance:    Right Eye Near:   Left Eye Near:    Bilateral Near:     Physical Exam Vitals reviewed.  Constitutional:      General: He is awake.     Appearance: Normal appearance. He is well-developed and well-groomed.  HENT:     Head: Normocephalic and atraumatic.     Right Ear: Hearing, tympanic membrane and ear canal normal.     Left Ear: Hearing, tympanic membrane and ear canal normal.     Mouth/Throat:     Lips: Pink.     Mouth: Mucous membranes are moist.     Pharynx: Oropharynx is clear. Uvula midline. No pharyngeal swelling, oropharyngeal exudate, posterior oropharyngeal erythema, uvula swelling or postnasal drip.     Tonsils: No tonsillar exudate or tonsillar abscesses.  Eyes:     General: Lids are normal. Gaze aligned  appropriately.     Extraocular Movements: Extraocular movements intact.  Cardiovascular:     Rate and Rhythm: Normal rate and regular rhythm.     Heart sounds: Normal heart sounds.  Pulmonary:  Effort: Pulmonary effort is normal.     Breath sounds: Decreased air movement present. Examination of the right-lower field reveals decreased breath sounds. Examination of the left-lower field reveals decreased breath sounds. Decreased breath sounds present. No wheezing, rhonchi or rales.     Comments: Pt has mildly decreased air movement towards lung bases but no wheezing, rales, rhonchi.  Musculoskeletal:     Cervical back: Normal range of motion and neck supple.  Lymphadenopathy:     Head:     Right side of head: No submental, submandibular or preauricular adenopathy.     Left side of head: No submental, submandibular or preauricular adenopathy.     Cervical:     Right cervical: No superficial cervical adenopathy.    Left cervical: No superficial cervical adenopathy.     Upper Body:     Right upper body: No supraclavicular adenopathy.     Left upper body: No supraclavicular adenopathy.  Skin:    General: Skin is warm and dry.  Neurological:     General: No focal deficit present.     Mental Status: He is alert and oriented to person, place, and time.  Psychiatric:        Mood and Affect: Mood normal.        Behavior: Behavior normal. Behavior is cooperative.        Thought Content: Thought content normal.        Judgment: Judgment normal.      UC Treatments / Results  Labs (all labs ordered are listed, but only abnormal results are displayed) Labs Reviewed - No data to display  EKG   Radiology No results found.  Procedures Procedures (including critical care time)  Medications Ordered in UC Medications - No data to display  Initial Impression / Assessment and Plan / UC Course  I have reviewed the triage vital signs and the nursing notes.  Pertinent labs & imaging  results that were available during my care of the patient were reviewed by me and considered in my medical decision making (see chart for details).      Final Clinical Impressions(s) / UC Diagnoses   Final diagnoses:  Productive cough  Fatigue, unspecified type   Patient presents today with concerns for persistent productive coughing, body aches and fatigue.  He reports this has been ongoing for about 3 weeks.  He was seen for similar concerns on 10/18/2023 and 10/20/2023.  His visit on 10/20/2023 he was treated with Z-Pak and guaifenesin .  Chest x-ray from that appointment was negative.  Physical exam today reveals mildly decreased air movement at the lung bases but vitals and oxygen saturation are reassuring.  Patient reports that his symptoms did seem to improve when he was on Z-Pak and his coughing seems to improve with inhaler use.  Patient appears to have uncontrolled type 2 diabetes so I am hesitant to start an oral steroid but I think this may be necessary to assist with his breathing and coughing.  Will start Medrol  Dosepak to assist with symptoms as well as Augmentin  in case of potential sinus infection/pneumonia.  Reviewed that he can continue taking over-the-counter medications as needed for further symptomatic relief and I am encouraging him to utilize his albuterol  rescue inhaler a bit more frequently as needed for shortness of breath and coughing.  ED and return precautions reviewed and provided in after visit summary.  Follow-up as needed for progressing or persistent symptoms    Discharge Instructions      You  were seen today for concerns of a persistent, productive cough and ongoing fatigue as well as body aches. I am sending you home on an antibiotic called Augmentin  for you to take by mouth twice per day for 7 days.  This should help with any bacterial cause of your symptoms such as pneumonia or sinus infection. To help with your breathing I am sending you home on a steroid taper  called methylprednisolone  also known as a Medrol  Dosepak.  Please follow the instructions for administration that are included with the medication package. Please note that steroids can increase your blood sugar levels and cause increased hunger and irritability.  Since you have diabetes and a high A1c please refrain from excessive intake of sugars or carbohydrates and try to monitor your blood sugar levels while on the medication. You can continue to take Mucinex  and Tylenol as needed for further symptomatic relief. If you notice any of the following please return here or go to the emergency room: Significant shortness of breath or trouble breathing, chest pain, feeling faint or passing out, fever or chills not responding to Tylenol, severe headache, confusion     ED Prescriptions     Medication Sig Dispense Auth. Provider   methylPREDNISolone  (MEDROL  DOSEPAK) 4 MG TBPK tablet Take per instructions on package. 21 each Virjean Boman E, PA-C   amoxicillin -clavulanate (AUGMENTIN ) 875-125 MG tablet Take 1 tablet by mouth every 12 (twelve) hours. 14 tablet Rakayla Ricklefs E, PA-C      PDMP not reviewed this encounter.   Donyae Kilner, Rocky BRAVO, PA-C 11/01/23 1326

## 2023-11-01 NOTE — Discharge Instructions (Signed)
 You were seen today for concerns of a persistent, productive cough and ongoing fatigue as well as body aches. I am sending you home on an antibiotic called Augmentin  for you to take by mouth twice per day for 7 days.  This should help with any bacterial cause of your symptoms such as pneumonia or sinus infection. To help with your breathing I am sending you home on a steroid taper called methylprednisolone  also known as a Medrol  Dosepak.  Please follow the instructions for administration that are included with the medication package. Please note that steroids can increase your blood sugar levels and cause increased hunger and irritability.  Since you have diabetes and a high A1c please refrain from excessive intake of sugars or carbohydrates and try to monitor your blood sugar levels while on the medication. You can continue to take Mucinex  and Tylenol as needed for further symptomatic relief. If you notice any of the following please return here or go to the emergency room: Significant shortness of breath or trouble breathing, chest pain, feeling faint or passing out, fever or chills not responding to Tylenol, severe headache, confusion

## 2023-12-05 ENCOUNTER — Emergency Department (HOSPITAL_BASED_OUTPATIENT_CLINIC_OR_DEPARTMENT_OTHER)

## 2023-12-05 ENCOUNTER — Other Ambulatory Visit: Payer: Self-pay

## 2023-12-05 ENCOUNTER — Encounter (HOSPITAL_BASED_OUTPATIENT_CLINIC_OR_DEPARTMENT_OTHER): Payer: Self-pay

## 2023-12-05 ENCOUNTER — Ambulatory Visit: Payer: Self-pay

## 2023-12-05 ENCOUNTER — Observation Stay (HOSPITAL_BASED_OUTPATIENT_CLINIC_OR_DEPARTMENT_OTHER)
Admission: EM | Admit: 2023-12-05 | Discharge: 2023-12-06 | Disposition: A | Discharge status: Home / Self Care | Attending: Internal Medicine | Admitting: Internal Medicine

## 2023-12-05 ENCOUNTER — Ambulatory Visit
Admission: EM | Admit: 2023-12-05 | Discharge: 2023-12-05 | Disposition: A | Discharge status: Home / Self Care | Attending: Physician Assistant | Admitting: Physician Assistant

## 2023-12-05 DIAGNOSIS — Z7984 Long term (current) use of oral hypoglycemic drugs: Secondary | ICD-10-CM | POA: Diagnosis not present

## 2023-12-05 DIAGNOSIS — E1110 Type 2 diabetes mellitus with ketoacidosis without coma: Principal | ICD-10-CM | POA: Diagnosis present

## 2023-12-05 DIAGNOSIS — R42 Dizziness and giddiness: Secondary | ICD-10-CM | POA: Diagnosis not present

## 2023-12-05 DIAGNOSIS — E1165 Type 2 diabetes mellitus with hyperglycemia: Secondary | ICD-10-CM

## 2023-12-05 DIAGNOSIS — F17210 Nicotine dependence, cigarettes, uncomplicated: Secondary | ICD-10-CM | POA: Diagnosis not present

## 2023-12-05 DIAGNOSIS — R531 Weakness: Secondary | ICD-10-CM | POA: Diagnosis not present

## 2023-12-05 DIAGNOSIS — Z794 Long term (current) use of insulin: Secondary | ICD-10-CM | POA: Insufficient documentation

## 2023-12-05 DIAGNOSIS — E871 Hypo-osmolality and hyponatremia: Secondary | ICD-10-CM | POA: Insufficient documentation

## 2023-12-05 DIAGNOSIS — R0989 Other specified symptoms and signs involving the circulatory and respiratory systems: Secondary | ICD-10-CM | POA: Diagnosis not present

## 2023-12-05 DIAGNOSIS — R7401 Elevation of levels of liver transaminase levels: Secondary | ICD-10-CM | POA: Diagnosis not present

## 2023-12-05 DIAGNOSIS — Z7401 Bed confinement status: Secondary | ICD-10-CM | POA: Diagnosis not present

## 2023-12-05 LAB — BASIC METABOLIC PANEL WITH GFR
Anion gap: 16 — ABNORMAL HIGH (ref 5–15)
Anion gap: 13 (ref 5–15)
Anion gap: 12 (ref 5–15)
BUN: 16 mg/dL (ref 6–20)
BUN: 13 mg/dL (ref 6–20)
BUN: 16 mg/dL (ref 6–20)
CO2: 19 mmol/L — ABNORMAL LOW (ref 22–32)
CO2: 20 mmol/L — ABNORMAL LOW (ref 22–32)
CO2: 22 mmol/L (ref 22–32)
Calcium: 9.5 mg/dL (ref 8.9–10.3)
Calcium: 8.8 mg/dL — ABNORMAL LOW (ref 8.9–10.3)
Calcium: 8.6 mg/dL — ABNORMAL LOW (ref 8.9–10.3)
Chloride: 96 mmol/L — ABNORMAL LOW (ref 98–111)
Chloride: 106 mmol/L (ref 98–111)
Chloride: 106 mmol/L (ref 98–111)
Creatinine, Ser: 0.95 mg/dL (ref 0.61–1.24)
Creatinine, Ser: 0.87 mg/dL (ref 0.61–1.24)
Creatinine, Ser: 1.21 mg/dL (ref 0.61–1.24)
GFR, Estimated: >60 mL/min (ref >60)
GFR, Estimated: >60 mL/min (ref >60)
GFR, Estimated: >60 mL/min (ref >60)
Glucose, Bld: 551 mg/dL (ref 70–99)
Glucose, Bld: 298 mg/dL — ABNORMAL HIGH (ref 70–99)
Glucose, Bld: 218 mg/dL — ABNORMAL HIGH (ref 70–99)
Potassium: 4.6 mmol/L (ref 3.5–5.1)
Potassium: 4.1 mmol/L (ref 3.5–5.1)
Potassium: 4 mmol/L (ref 3.5–5.1)
Sodium: 132 mmol/L — ABNORMAL LOW (ref 135–145)
Sodium: 139 mmol/L (ref 135–145)
Sodium: 140 mmol/L (ref 135–145)

## 2023-12-05 LAB — CBC WITH DIFFERENTIAL/PLATELET
Abs Immature Granulocytes: 0.05 K/uL (ref 0.00–0.07)
Abs Immature Granulocytes: 0.07 K/uL (ref 0.00–0.07)
Basophils Absolute: 0 K/uL (ref 0.0–0.1)
Basophils Absolute: 0 K/uL (ref 0.0–0.1)
Basophils Relative: 1 %
Basophils Relative: 0 %
Eosinophils Absolute: 0.3 K/uL (ref 0.0–0.5)
Eosinophils Absolute: 0.3 K/uL (ref 0.0–0.5)
Eosinophils Relative: 4 %
Eosinophils Relative: 4 %
HCT: 44.7 % (ref 39.0–52.0)
HCT: 44.5 % (ref 39.0–52.0)
Hemoglobin: 15.9 g/dL (ref 13.0–17.0)
Hemoglobin: 14.7 g/dL (ref 13.0–17.0)
Immature Granulocytes: 1 %
Immature Granulocytes: 1 %
Lymphocytes Relative: 16 %
Lymphocytes Relative: 23 %
Lymphs Abs: 1.3 K/uL (ref 0.7–4.0)
Lymphs Abs: 1.5 K/uL (ref 0.7–4.0)
MCH: 30.2 pg (ref 26.0–34.0)
MCH: 28.8 pg (ref 26.0–34.0)
MCHC: 35.6 g/dL (ref 30.0–36.0)
MCHC: 33 g/dL (ref 30.0–36.0)
MCV: 85 fL (ref 80.0–100.0)
MCV: 87.3 fL (ref 80.0–100.0)
Monocytes Absolute: 0.7 K/uL (ref 0.1–1.0)
Monocytes Absolute: 0.8 K/uL (ref 0.1–1.0)
Monocytes Relative: 10 %
Monocytes Relative: 12 %
Neutro Abs: 5.4 K/uL (ref 1.7–7.7)
Neutro Abs: 4 K/uL (ref 1.7–7.7)
Neutrophils Relative %: 68 %
Neutrophils Relative %: 60 %
Platelets: 274 K/uL (ref 150–400)
Platelets: 279 K/uL (ref 150–400)
RBC: 5.26 MIL/uL (ref 4.22–5.81)
RBC: 5.1 MIL/uL (ref 4.22–5.81)
RDW: 12 % (ref 11.5–15.5)
RDW: 12.1 % (ref 11.5–15.5)
WBC: 7.8 K/uL (ref 4.0–10.5)
WBC: 6.7 K/uL (ref 4.0–10.5)
nRBC: 0 % (ref 0.0–0.2)
nRBC: 0 % (ref 0.0–0.2)

## 2023-12-05 LAB — RESP PANEL BY RT-PCR (RSV, FLU A&B, COVID)  RVPGX2
Influenza A by PCR: NEGATIVE
Influenza B by PCR: NEGATIVE
Resp Syncytial Virus by PCR: NEGATIVE
SARS Coronavirus 2 by RT PCR: NEGATIVE

## 2023-12-05 LAB — I-STAT VENOUS BLOOD GAS, ED
Acid-base deficit: 2 mmol/L (ref 0.0–2.0)
Bicarbonate: 24.2 mmol/L (ref 20.0–28.0)
Calcium, Ion: 1.2 mmol/L (ref 1.15–1.40)
HCT: 43 % (ref 39.0–52.0)
Hemoglobin: 14.6 g/dL (ref 13.0–17.0)
O2 Saturation: 72 %
Patient temperature: 98.7
Potassium: 4.7 mmol/L (ref 3.5–5.1)
Sodium: 135 mmol/L (ref 135–145)
TCO2: 26 mmol/L (ref 22–32)
pCO2, Ven: 47.7 mmHg (ref 44–60)
pH, Ven: 7.315 (ref 7.25–7.43)
pO2, Ven: 42 mmHg (ref 32–45)

## 2023-12-05 LAB — URINALYSIS, ROUTINE W REFLEX MICROSCOPIC
Bilirubin Urine: NEGATIVE
Glucose, UA: >=500 mg/dL — AB
Hgb urine dipstick: NEGATIVE
Ketones, ur: NEGATIVE mg/dL
Leukocytes,Ua: NEGATIVE
Nitrite: NEGATIVE
Protein, ur: NEGATIVE mg/dL
Specific Gravity, Urine: 1.01 (ref 1.005–1.030)
pH: 5.5 (ref 5.0–8.0)

## 2023-12-05 LAB — URINALYSIS, MICROSCOPIC (REFLEX)

## 2023-12-05 LAB — CBG MONITORING, ED
Glucose-Capillary: 522 mg/dL (ref 70–99)
Glucose-Capillary: 467 mg/dL — ABNORMAL HIGH (ref 70–99)
Glucose-Capillary: 352 mg/dL — ABNORMAL HIGH (ref 70–99)
Glucose-Capillary: 180 mg/dL — ABNORMAL HIGH (ref 70–99)

## 2023-12-05 LAB — COMPREHENSIVE METABOLIC PANEL WITH GFR
ALT: 57 U/L — ABNORMAL HIGH (ref 0–44)
AST: 39 U/L (ref 15–41)
Albumin: 4 g/dL (ref 3.5–5.0)
Alkaline Phosphatase: 100 U/L (ref 38–126)
Anion gap: 14 (ref 5–15)
BUN: 12 mg/dL (ref 6–20)
CO2: 18 mmol/L — ABNORMAL LOW (ref 22–32)
Calcium: 9 mg/dL (ref 8.9–10.3)
Chloride: 108 mmol/L (ref 98–111)
Creatinine, Ser: 0.81 mg/dL (ref 0.61–1.24)
GFR, Estimated: >60 mL/min (ref >60)
Glucose, Bld: 133 mg/dL — ABNORMAL HIGH (ref 70–99)
Potassium: 4.2 mmol/L (ref 3.5–5.1)
Sodium: 140 mmol/L (ref 135–145)
Total Bilirubin: 0.4 mg/dL (ref 0.0–1.2)
Total Protein: 6.5 g/dL (ref 6.5–8.1)

## 2023-12-05 LAB — POC COVID19/FLU A&B COMBO
Covid Antigen, POC: NEGATIVE
Influenza A Antigen, POC: NEGATIVE
Influenza B Antigen, POC: NEGATIVE

## 2023-12-05 LAB — POCT RAPID STREP A (OFFICE): Rapid Strep A Screen: NEGATIVE

## 2023-12-05 LAB — BETA-HYDROXYBUTYRIC ACID
Beta-Hydroxybutyric Acid: 0.14 mmol/L (ref 0.05–0.27)
Beta-Hydroxybutyric Acid: 0.16 mmol/L (ref 0.05–0.27)
Beta-Hydroxybutyric Acid: 0.09 mmol/L (ref 0.05–0.27)

## 2023-12-05 LAB — GLUCOSE, CAPILLARY
Glucose-Capillary: 127 mg/dL — ABNORMAL HIGH (ref 70–99)
Glucose-Capillary: 154 mg/dL — ABNORMAL HIGH (ref 70–99)
Glucose-Capillary: 166 mg/dL — ABNORMAL HIGH (ref 70–99)
Glucose-Capillary: 236 mg/dL — ABNORMAL HIGH (ref 70–99)

## 2023-12-05 LAB — PHOSPHORUS: Phosphorus: 3 mg/dL (ref 2.5–4.6)

## 2023-12-05 LAB — GLUCOSE, POCT (MANUAL RESULT ENTRY): POC Glucose: 549 mg/dL — AB (ref 70–99)

## 2023-12-05 LAB — MAGNESIUM: Magnesium: 2.3 mg/dL (ref 1.7–2.4)

## 2023-12-05 MED ORDER — ONDANSETRON HCL 4 MG/2ML IJ SOLN: 4.0000 mg | Freq: Four times a day (QID) | INTRAMUSCULAR | Status: DC | PRN

## 2023-12-05 MED ORDER — CHLORHEXIDINE GLUCONATE CLOTH 2 % EX PADS
6.0000 | MEDICATED_PAD | Freq: Every day | CUTANEOUS | Status: DC
  Administered 2023-12-05: 6 via TOPICAL

## 2023-12-05 MED ORDER — POTASSIUM CHLORIDE 10 MEQ/100ML IV SOLN
10.0000 meq | INTRAVENOUS | Status: AC
  Administered 2023-12-05 (×2): 10 meq via INTRAVENOUS
  Filled 2023-12-05 (×2): qty 100

## 2023-12-05 MED ORDER — LIVING WELL WITH DIABETES BOOK
Freq: Once | Status: AC
  Filled 2023-12-05: qty 1

## 2023-12-05 MED ORDER — INSULIN ASPART 100 UNIT/ML IJ SOLN
0.0000 [IU] | Freq: Every day | INTRAMUSCULAR | Status: DC
  Administered 2023-12-05: 2 [IU] via SUBCUTANEOUS

## 2023-12-05 MED ORDER — INSULIN ASPART 100 UNIT/ML IJ SOLN
0.0000 [IU] | Freq: Three times a day (TID) | INTRAMUSCULAR | Status: DC
  Administered 2023-12-06: 5 [IU] via SUBCUTANEOUS
  Administered 2023-12-06: 2 [IU] via SUBCUTANEOUS

## 2023-12-05 MED ORDER — LACTATED RINGERS IV SOLN: INTRAVENOUS | Status: DC

## 2023-12-05 MED ORDER — DEXTROSE IN LACTATED RINGERS 5 % IV SOLN: INTRAVENOUS | Status: AC

## 2023-12-05 MED ORDER — SODIUM BICARBONATE 650 MG PO TABS
650.0000 mg | ORAL_TABLET | Freq: Two times a day (BID) | ORAL | Status: DC
  Administered 2023-12-05 – 2023-12-06 (×2): 650 mg via ORAL
  Filled 2023-12-05 (×2): qty 1

## 2023-12-05 MED ORDER — SODIUM CHLORIDE 0.9 % IV BOLUS
2000.0000 mL | Freq: Once | INTRAVENOUS | Status: AC
  Administered 2023-12-05: 2000 mL via INTRAVENOUS

## 2023-12-05 MED ORDER — DEXTROSE 50 % IV SOLN: 0.0000 mL | INTRAVENOUS | Status: DC | PRN

## 2023-12-05 MED ORDER — INSULIN GLARGINE 100 UNIT/ML ~~LOC~~ SOLN
15.0000 [IU] | Freq: Every day | SUBCUTANEOUS | Status: DC
  Administered 2023-12-05 – 2023-12-06 (×2): 15 [IU] via SUBCUTANEOUS
  Filled 2023-12-05 (×2): qty 0.15

## 2023-12-05 MED ORDER — ORAL CARE MOUTH RINSE: 15.0000 mL | OROMUCOSAL | Status: DC | PRN

## 2023-12-05 MED ORDER — INSULIN REGULAR(HUMAN) IN NACL 100-0.9 UT/100ML-% IV SOLN
INTRAVENOUS | Status: DC
  Administered 2023-12-05: 17 [IU]/h via INTRAVENOUS
  Filled 2023-12-05: qty 100

## 2023-12-05 NOTE — Plan of Care (Signed)
 Insulin  drip and endotool stopped at approximately 2045 per protocol and physician orders. Patient given Living Well with Diabetes book and educated about lifestyle changes that will need to be made. Patient denies any nicotine cravings at this time as he states he only smokes due to recent increased stress. Patient educated about insulin  doses given and plan for tonight/tomorrow with further education.    Problem: Education: Goal: Knowledge of General Education information will improve Description: Including pain rating scale, medication(s)/side effects and non-pharmacologic comfort measures Outcome: Progressing   Problem: Health Behavior/Discharge Planning: Goal: Ability to manage health-related needs will improve Outcome: Progressing   Problem: Clinical Measurements: Goal: Ability to maintain clinical measurements within normal limits will improve Outcome: Progressing Goal: Will remain free from infection Outcome: Progressing Goal: Diagnostic test results will improve Outcome: Progressing Goal: Respiratory complications will improve Outcome: Progressing Goal: Cardiovascular complication will be avoided Outcome: Progressing   Problem: Activity: Goal: Risk for activity intolerance will decrease Outcome: Progressing   Problem: Nutrition: Goal: Adequate nutrition will be maintained Outcome: Progressing   Problem: Coping: Goal: Level of anxiety will decrease Outcome: Progressing   Problem: Elimination: Goal: Will not experience complications related to bowel motility Outcome: Progressing Goal: Will not experience complications related to urinary retention Outcome: Progressing   Problem: Pain Managment: Goal: General experience of comfort will improve and/or be controlled Outcome: Progressing   Problem: Safety: Goal: Ability to remain free from injury will improve Outcome: Progressing   Problem: Skin Integrity: Goal: Risk for impaired skin integrity will  decrease Outcome: Progressing   Problem: Education: Goal: Ability to describe self-care measures that may prevent or decrease complications (Diabetes Survival Skills Education) will improve Outcome: Progressing   Problem: Cardiac: Goal: Ability to maintain an adequate cardiac output will improve Outcome: Progressing   Problem: Health Behavior/Discharge Planning: Goal: Ability to identify and utilize available resources and services will improve Outcome: Progressing Goal: Ability to manage health-related needs will improve Outcome: Progressing   Problem: Fluid Volume: Goal: Ability to achieve a balanced intake and output will improve Outcome: Progressing   Problem: Metabolic: Goal: Ability to maintain appropriate glucose levels will improve Outcome: Progressing   Problem: Nutritional: Goal: Maintenance of adequate nutrition will improve Outcome: Progressing Goal: Maintenance of adequate weight for body size and type will improve Outcome: Progressing   Problem: Respiratory: Goal: Will regain and/or maintain adequate ventilation Outcome: Progressing   Problem: Urinary Elimination: Goal: Ability to achieve and maintain adequate renal perfusion and functioning will improve Outcome: Progressing

## 2023-12-05 NOTE — Care Plan (Signed)
 Plan of Care Note for accepted transfer   Patient: James Brock MRN: [id number]   DOA: 12/05/2023  Facility requesting transfer: [facility] Requesting Provider: Dr. Emil Reason for transfer: Early DKA Facility course: Patient 53 year old gentleman initially presented to urgent care center with complaints of weakness, inability to sit up, body aches, tingling in his legs which began on the morning of admission.  Patient also with some blurry vision, polyuria, polydipsia, polyphagia.  Also with some complaints of dizziness with head movements or bending over and due to concerns for possible DKA sent to the ED.  Patient seen in the ED it was noted that patient recently started on Ozempic and due to GI intolerance discontinued Ozempic and since then has been having difficulty managing his blood sugars.  Blood sugars noted to be elevated and noted in the 500s on presentation to the ED.  Basic metabolic profile done with a glucose of 551, bicarb of 19, anion gap of 16.  Urinalysis with glycosuria.  Chest x-ray unremarkable..  Beta hydroxybutyric acid pending.  Patient started on Endo tool and IV fluids.  Plan of care: The patient is accepted for admission to [facility] unit, at [facility]..   Author: Toribio Hummer, MD 12/05/2023  Check [email or web address] for on-call coverage.  Nursing staff, Please call [facility] Admits & Consults System-Wide number on Amion as soon as patient's arrival, so appropriate admitting provider can evaluate the pt.

## 2023-12-05 NOTE — ED Provider Notes (Signed)
 [place] [facility] Provider Note   CSN: [id number] Arrival date & time: 12/05/23  1001     Patient presents with: Hyperglycemia   James Brock is a 53 y.o. male.   52 yo M with a chief complaint of high blood sugar.  Patient had been started on a novel injection medication for his diabetes and was not tolerating it well because it made him sick to his stomach and did not feel like eating or drinking.  He self discontinued this recently and since then has been having trouble controlling his blood sugars.  He has had a little bit of a cough otherwise denies fevers chills nausea vomiting or diarrhea.  He just feels very fatigued.  Polyphasia polyuria polydipsia.  Denies chest pain or shortness of breath.   Hyperglycemia      Prior to Admission medications   Medication Sig Start Date End Date Taking? Authorizing Provider  albuterol  (VENTOLIN  HFA) 108 (90 Base) MCG/ACT inhaler Inhale 1-2 puffs into the lungs every 6 (six) hours as needed for wheezing or shortness of breath. 10/18/23   Mecum, Erin E, PA-C  ALPRAZolam (XANAX) 0.5 MG tablet Take 0.5 mg by mouth at bedtime as needed for anxiety.    [provider]  atorvastatin (LIPITOR) 40 MG tablet Take 40 mg by mouth daily.    [provider]  busPIRone (BUSPAR) 10 MG tablet Take 10 mg by mouth 2 (two) times daily.    [provider]  DULoxetine (CYMBALTA) 60 MG capsule Take 60 mg by mouth daily. 05/06/22   [provider]  fexofenadine (ALLEGRA) 180 MG tablet Take 180 mg by mouth daily.    [provider]  GLYBURIDE-METFORMIN  PO Take by mouth.    [provider]  Guaifenesin  1200 MG TB12 Take 1 tablet (1,200 mg total) by mouth in the morning and at bedtime. 10/20/23   Enedelia Dorna HERO, FNP  OZEMPIC, 2 MG/DOSE, 8 MG/3ML SOPN Inject into the skin.    [provider]    Allergies: Patient has no known allergies.    Review of  Systems  Updated Vital Signs BP 131/83 (BP Location: Left Arm)   Pulse 72   Temp 98.3 F (36.8 C) (Oral)   Resp 16   Wt 79.4 kg   SpO2 98%   BMI 30.04 kg/m   Physical Exam Vitals and nursing note reviewed.  Constitutional:      Appearance: He is well-developed.  HENT:     Head: Normocephalic and atraumatic.  Eyes:     Pupils: Pupils are equal, round, and reactive to light.  Neck:     Vascular: No JVD.  Cardiovascular:     Rate and Rhythm: Normal rate and regular rhythm.     Heart sounds: No murmur heard.    No friction rub. No gallop.  Pulmonary:     Effort: No respiratory distress.     Breath sounds: No wheezing.  Abdominal:     General: There is no distension.     Tenderness: There is no abdominal tenderness. There is no guarding or rebound.  Musculoskeletal:        General: Normal range of motion.     Cervical back: Normal range of motion and neck supple.  Skin:    Coloration: Skin is not pale.     Findings: No rash.  Neurological:     Mental Status: He is alert and oriented to person, place, and time.  Psychiatric:  Behavior: Behavior normal.     (all labs ordered are listed, but only abnormal results are displayed) Labs Reviewed  BASIC METABOLIC PANEL WITH GFR - Abnormal; Notable for the following components:      Result Value   Sodium 132 (*)    Chloride 96 (*)    CO2 19 (*)    Glucose, Bld 551 (*)    Anion gap 16 (*)    All other components within normal limits  URINALYSIS, ROUTINE W REFLEX MICROSCOPIC - Abnormal; Notable for the following components:   Glucose, UA >=500 (*)    All other components within normal limits  URINALYSIS, MICROSCOPIC (REFLEX) - Abnormal; Notable for the following components:   Bacteria, UA RARE (*)    All other components within normal limits  CBG MONITORING, ED - Abnormal; Notable for the following components:   Glucose-Capillary 522 (*)    All other components within normal limits  CBG MONITORING, ED -  Abnormal; Notable for the following components:   Glucose-Capillary 467 (*)    All other components within normal limits  CBG MONITORING, ED - Abnormal; Notable for the following components:   Glucose-Capillary 352 (*)    All other components within normal limits  RESP PANEL BY RT-PCR (RSV, FLU A&B, COVID)  RVPGX2  CBC WITH DIFFERENTIAL/PLATELET  BASIC METABOLIC PANEL WITH GFR  BASIC METABOLIC PANEL WITH GFR  BASIC METABOLIC PANEL WITH GFR  BETA-HYDROXYBUTYRIC ACID  BETA-HYDROXYBUTYRIC ACID  BETA-HYDROXYBUTYRIC ACID  BETA-HYDROXYBUTYRIC ACID  BASIC METABOLIC PANEL WITH GFR  I-STAT VENOUS BLOOD GAS, ED    EKG: None  Radiology: [facility] Chest Port 1 View Result Date: 12/05/2023 CLINICAL DATA:  Cough. EXAM: PORTABLE CHEST 1 VIEW COMPARISON:  10/20/2023 FINDINGS: The lungs are clear without focal pneumonia, edema, pneumothorax or pleural effusion. The cardiopericardial silhouette is within normal limits for size. No acute bony abnormality. IMPRESSION: No active disease. Electronically Signed   By: Camellia Candle M.D.   On: 12/05/2023 11:32     .Critical Care  Performed by: Emil Share, DO Authorized by: Emil Share, DO   Critical care provider statement:    Critical care time (minutes):  35   Critical care time was exclusive of:  Separately billable procedures and treating other patients   Critical care was time spent personally by me on the following activities:  Development of treatment plan with patient or surrogate, discussions with consultants, evaluation of patient's response to treatment, examination of patient, ordering and review of laboratory studies, ordering and review of radiographic studies, ordering and performing treatments and interventions, pulse oximetry, re-evaluation of patient's condition and review of old charts   Care discussed with: admitting provider      Medications Ordered in the ED  insulin  regular, human (MYXREDLIN ) 100 units/ 100 mL infusion (16 Units/hr  Intravenous Rate/Dose Change 12/05/23 1348)  lactated ringers  infusion ( Intravenous New Bag/Given 12/05/23 1313)  dextrose  5 % in lactated ringers  infusion (has no administration in time range)  dextrose  50 % solution 0-50 mL (has no administration in time range)  potassium chloride  10 mEq in 100 mL IVPB (10 mEq Intravenous New Bag/Given 12/05/23 1346)  sodium chloride  0.9 % bolus 2,000 mL (0 mLs Intravenous Stopped 12/05/23 1313)                                    Medical Decision Making Amount and/or Complexity of Data Reviewed Labs: ordered. Radiology: ordered.  Risk Prescription drug management. Decision regarding hospitalization.   53 yo M with a chief complaints of hyperglycemia.  Patient had stopped his Ozempic because it was making his stomach upset.  It sounds like there were no other medication changes and his blood sugars been elevated somewhat since.    Will bolus IV fluids.  Lab work.  Reassess.  Lab work is unfortunately consistent with a mild diabetic ketoacidosis with metabolic acidosis with anion gap.  Patient reassessed and continues to feel generally fatigued.  Will start on insulin  infusion.  Will discuss with medicine.  The patients results and plan were reviewed and discussed.   Any x-rays performed were independently reviewed by myself.   Differential diagnosis were considered with the presenting HPI.  Medications  insulin  regular, human (MYXREDLIN ) 100 units/ 100 mL infusion (16 Units/hr Intravenous Rate/Dose Change 12/05/23 1348)  lactated ringers  infusion ( Intravenous New Bag/Given 12/05/23 1313)  dextrose  5 % in lactated ringers  infusion (has no administration in time range)  dextrose  50 % solution 0-50 mL (has no administration in time range)  potassium chloride  10 mEq in 100 mL IVPB (10 mEq Intravenous New Bag/Given 12/05/23 1346)  sodium chloride  0.9 % bolus 2,000 mL (0 mLs Intravenous Stopped 12/05/23 1313)    Vitals:   12/05/23 1020 12/05/23 1215  12/05/23 1230 12/05/23 1345  BP:  131/83 102/76 131/83  Pulse:  70  72  Resp:    16  Temp:    98.3 F (36.8 C)  TempSrc:    Oral  SpO2:  98%  98%  Weight: 79.4 kg       Final diagnoses:  Diabetic ketoacidosis without coma associated with type 2 diabetes mellitus (HCC)    Admission/ observation were discussed with the admitting physician, patient and/or family and they are comfortable with the plan.       Final diagnoses:  Diabetic ketoacidosis without coma associated with type 2 diabetes mellitus [facility])    ED Discharge Orders     None          Emil Share, DO 12/05/23 1424

## 2023-12-05 NOTE — H&P (Signed)
 History and Physical    Patient: James Brock FMW:[id number] DOB: Jul 01, 1970 DOA: 12/05/2023 DOS: the patient was seen and examined on 12/05/2023 PCP: Hyacinth Honey, NP  Patient coming from: Home  Chief Complaint:  Chief Complaint  Patient presents with   Hyperglycemia   HPI: James Brock is a 53 y.o. male with medical history significant for Anxiety, Diabetes Mellitus Type 2, HLD and other comorbidities who initially presented to urgent care center with complaints of generalized weakness, inability to sit up, body aches, tingling in his legs.  He also noted some some blurry vision, polyuria, polydipsia, polyphagia.  In addition he has some dizziness with some of his head movements or bending over and went to urgent care who then sent him over to the ED at [facility] after working him up with point-of-care tests.    He denied any chest pain, or shortness of breath or burning or discomfort in his urine.  He does states that he is urinating quite frequently.  States that he has been not drinking any sugary drinks or having any dietary indiscretions.  However patient needs to check his blood sugars no longer checking blood sugars.  He was initially on Ozempic but discontinued due to GI intolerance and since then has been having difficulty managing his blood sugars and has not really been taking them as he should.  He states is occasionally his blood sugars go to the 500s.  Given his symptoms he presented to the ED and in the ED he was given IV fluids and was placed on insulin  drip.  TRH was asked to admit this patient for hyperglycemic crisis with concern for possible DKA.   Review of Systems: As mentioned in the history of present illness. All other systems reviewed and are negative. Past Medical History:  Diagnosis Date   Anxiety    Diabetes mellitus without complication (HCC)    High cholesterol    History reviewed. No pertinent surgical history. Social History:  reports that he has been  smoking cigarettes. He has never used smokeless tobacco. He reports that he does not drink alcohol and does not use drugs.  No Known Allergies  History reviewed. No pertinent family history.  Prior to Admission medications   Medication Sig Start Date End Date Taking? Authorizing Provider  albuterol  (VENTOLIN  HFA) 108 (90 Base) MCG/ACT inhaler Inhale 1-2 puffs into the lungs every 6 (six) hours as needed for wheezing or shortness of breath. 10/18/23   Mecum, Erin E, PA-C  ALPRAZolam (XANAX) 0.5 MG tablet Take 0.5 mg by mouth at bedtime as needed for anxiety.    [provider]  atorvastatin (LIPITOR) 40 MG tablet Take 40 mg by mouth daily.    [provider]  busPIRone (BUSPAR) 10 MG tablet Take 10 mg by mouth 2 (two) times daily.    [provider]  DULoxetine (CYMBALTA) 60 MG capsule Take 60 mg by mouth daily. 05/06/22   [provider]  fexofenadine (ALLEGRA) 180 MG tablet Take 180 mg by mouth daily.    [provider]  GLYBURIDE-METFORMIN  PO Take by mouth.    [provider]  Guaifenesin  1200 MG TB12 Take 1 tablet (1,200 mg total) by mouth in the morning and at bedtime. 10/20/23   Enedelia Dorna HERO, FNP  OZEMPIC, 2 MG/DOSE, 8 MG/3ML SOPN Inject into the skin.    [provider]    Physical Exam: Vitals:   12/05/23 1500 12/05/23 1515 12/05/23 1638 12/05/23 1640  BP: 102/71 103/69  135/84  Pulse:    72  Resp:    17  Temp:   97.7 F (36.5 C)   TempSrc:   Oral   SpO2:    98%  Weight:   74.7 kg   Height:   5' 4 (1.626 m)    Examination: Physical Exam:  Constitutional: WN/WD overweight male in no acute distress Respiratory: Clear to auscultation bilaterally, no wheezing, rales, rhonchi or crackles. Normal respiratory effort and patient is not tachypenic. No accessory muscle use.  Unlabored breathing Cardiovascular: RRR, no murmurs / rubs / gallops. S1 and S2 auscultated. No extremity edema.  Abdomen: Soft, non-tender,  distended secondary to body habitus bowel sounds positive.  GU: Deferred. Musculoskeletal: No clubbing / cyanosis of digits/nails. No joint deformity upper and lower extremities.  Skin: No rashes, lesions, ulcers on limited skin evaluation. No induration; Warm and dry.  Neurologic: CN 2-12 grossly intact with no focal deficits. Romberg sign and cerebellar reflexes not assessed.  Psychiatric: Normal judgment and insight. Alert and oriented x 3. Normal mood and appropriate affect.   Data Reviewed:  Recent Results (from the past 2160 hours)  POCT rapid strep A     Status: None   Collection Time: 12/05/23  8:48 AM  Result Value Ref Range   Rapid Strep A Screen Negative Negative  POC Covid19/Flu A&B Antigen     Status: None   Collection Time: 12/05/23  8:53 AM  Result Value Ref Range   Influenza A Antigen, POC Negative Negative   Influenza B Antigen, POC Negative Negative   Covid Antigen, POC Negative Negative  POCT CBG (manual entry)     Status: Abnormal   Collection Time: 12/05/23  9:14 AM  Result Value Ref Range   POC Glucose 549 (A) 70 - 99 mg/dl  CBG monitoring, ED     Status: Abnormal   Collection Time: 12/05/23 10:08 AM  Result Value Ref Range   Glucose-Capillary 522 (HH) 70 - 99 mg/dL    Comment: Glucose reference range applies only to samples taken after fasting for at least 8 hours.   Comment 1 Notify RN   CBC with Differential     Status: None   Collection Time: 12/05/23 10:47 AM  Result Value Ref Range   WBC 7.8 4.0 - 10.5 K/uL   RBC 5.26 4.22 - 5.81 MIL/uL   Hemoglobin 15.9 13.0 - 17.0 g/dL   HCT 55.2 60.9 - 47.9 %   MCV 85.0 80.0 - 100.0 fL   MCH 30.2 26.0 - 34.0 pg   MCHC 35.6 30.0 - 36.0 g/dL   RDW 87.9 88.4 - 84.4 %   Platelets 274 150 - 400 K/uL   nRBC 0.0 0.0 - 0.2 %   Neutrophils Relative % 68 %   Neutro Abs 5.4 1.7 - 7.7 K/uL   Lymphocytes Relative 16 %   Lymphs Abs 1.3 0.7 - 4.0 K/uL   Monocytes Relative 10 %   Monocytes Absolute 0.7 0.1 - 1.0 K/uL    Eosinophils Relative 4 %   Eosinophils Absolute 0.3 0.0 - 0.5 K/uL   Basophils Relative 1 %   Basophils Absolute 0.0 0.0 - 0.1 K/uL   Immature Granulocytes 1 %   Abs Immature Granulocytes 0.05 0.00 - 0.07 K/uL    Comment: Performed at [facility], [address] [place], [place] [address]  Basic metabolic panel     Status: Abnormal   Collection Time: 12/05/23 10:47 AM  Result Value Ref Range  Sodium 132 (L) 135 - 145 mmol/L   Potassium 4.6 3.5 - 5.1 mmol/L   Chloride 96 (L) 98 - 111 mmol/L   CO2 19 (L) 22 - 32 mmol/L   Glucose, Bld 551 (HH) 70 - 99 mg/dL    Comment: Critical Value, Read Back and verified with R. CLEMENT RN AT 1133 12/05/23 MB Glucose reference range applies only to samples taken after fasting for at least 8 hours.    BUN 16 6 - 20 mg/dL   Creatinine, Ser 9.04 0.61 - 1.24 mg/dL   Calcium 9.5 8.9 - 89.6 mg/dL   GFR, Estimated >39 >39 mL/min    Comment: (NOTE) Calculated using the CKD-EPI Creatinine Equation (2021)    Anion gap 16 (H) 5 - 15    Comment: Performed at [facility], [address] [place], [place] [address]  Urinalysis, Routine w reflex microscopic -Urine, Clean Catch     Status: Abnormal   Collection Time: 12/05/23 10:47 AM  Result Value Ref Range   Color, Urine YELLOW YELLOW   APPearance CLEAR CLEAR   Specific Gravity, Urine 1.010 1.005 - 1.030   pH 5.5 5.0 - 8.0   Glucose, UA >=500 (A) NEGATIVE mg/dL   Hgb urine dipstick NEGATIVE NEGATIVE   Bilirubin Urine NEGATIVE NEGATIVE   Ketones, ur NEGATIVE NEGATIVE mg/dL   Protein, ur NEGATIVE NEGATIVE mg/dL   Nitrite NEGATIVE NEGATIVE   Leukocytes,Ua NEGATIVE NEGATIVE    Comment: Performed at [facility], [address] [place], [place] [address]  Resp panel by RT-PCR (RSV, Flu A&B, Covid) Anterior Nasal Swab     Status: None   Collection Time: 12/05/23 10:47 AM   Specimen: Anterior Nasal Swab  Result Value Ref Range   SARS Coronavirus 2 by RT PCR NEGATIVE  NEGATIVE    Comment: (NOTE) SARS-CoV-2 target nucleic acids are NOT DETECTED.  The SARS-CoV-2 RNA is generally detectable in upper respiratory specimens during the acute phase of infection. The lowest concentration of SARS-CoV-2 viral copies this assay can detect is 138 copies/mL. A negative result does not preclude SARS-Cov-2 infection and should not be used as the sole basis for treatment or other patient management decisions. A negative result may occur with  improper specimen collection/handling, submission of specimen other than nasopharyngeal swab, presence of viral mutation(s) within the areas targeted by this assay, and inadequate number of viral copies(<138 copies/mL). A negative result must be combined with clinical observations, patient history, and epidemiological information. The expected result is Negative.  Fact Sheet for Patients:  BloggerCourse.com  Fact Sheet for Healthcare Providers:  SeriousBroker.it  This test is no t yet approved or cleared by the [place]  FDA and  has been authorized for detection and/or diagnosis of SARS-CoV-2 by FDA under an Emergency Use Authorization (EUA). This EUA will remain  in effect (meaning this test can be used) for the duration of the COVID-19 declaration under Section 564(b)(1) of the Act, 21 U.S.C.section 360bbb-3(b)(1), unless the authorization is terminated  or revoked sooner.       Influenza A by PCR NEGATIVE NEGATIVE   Influenza B by PCR NEGATIVE NEGATIVE    Comment: (NOTE) The Xpert Xpress SARS-CoV-2/FLU/RSV plus assay is intended as an aid in the diagnosis of influenza from Nasopharyngeal swab specimens and should not be used as a sole basis for treatment. Nasal washings and aspirates are unacceptable for Xpert Xpress SARS-CoV-2/FLU/RSV testing.  Fact Sheet for Patients: BloggerCourse.com  Fact Sheet for Healthcare  Providers: SeriousBroker.it  This test is not yet approved or cleared by the [place]  FDA and has been authorized for detection and/or diagnosis of SARS-CoV-2 by FDA under an Emergency Use Authorization (EUA). This EUA will remain in effect (meaning this test can be used) for the duration of the COVID-19 declaration under Section 564(b)(1) of the Act, 21 U.S.C. section 360bbb-3(b)(1), unless the authorization is terminated or revoked.     Resp Syncytial Virus by PCR NEGATIVE NEGATIVE    Comment: (NOTE) Fact Sheet for Patients: BloggerCourse.com  Fact Sheet for Healthcare Providers: SeriousBroker.it  This test is not yet approved or cleared by the [place]  FDA and has been authorized for detection and/or diagnosis of SARS-CoV-2 by FDA under an Emergency Use Authorization (EUA). This EUA will remain in effect (meaning this test can be used) for the duration of the COVID-19 declaration under Section 564(b)(1) of the Act, 21 U.S.C. section 360bbb-3(b)(1), unless the authorization is terminated or revoked.  Performed at [facility], [address] [place], [place] [address]   Urinalysis, Microscopic (reflex)     Status: Abnormal   Collection Time: 12/05/23 10:47 AM  Result Value Ref Range   RBC / HPF 0-5 0 - 5 RBC/hpf   WBC, UA 0-5 0 - 5 WBC/hpf   Bacteria, UA RARE (A) NONE SEEN   Squamous Epithelial / HPF 0-5 0 - 5 /HPF    Comment: Performed at [facility], [address] [place], [place] [address]  Beta-hydroxybutyric acid     Status: None   Collection Time: 12/05/23 12:29 PM  Result Value Ref Range   Beta-Hydroxybutyric Acid 0.14 0.05 - 0.27 mmol/L    Comment: Performed at [facility], [address], [place], [place] [address]  CBG monitoring, ED     Status: Abnormal   Collection Time: 12/05/23 12:33 PM  Result Value Ref Range   Glucose-Capillary 467 (H)  70 - 99 mg/dL    Comment: Glucose reference range applies only to samples taken after fasting for at least 8 hours.  I-Stat venous blood gas, ED     Status: None   Collection Time: 12/05/23 12:56 PM  Result Value Ref Range   pH, Ven 7.315 7.25 - 7.43   pCO2, Ven 47.7 44 - 60 mmHg   pO2, Ven 42 32 - 45 mmHg   Bicarbonate 24.2 20.0 - 28.0 mmol/L   TCO2 26 22 - 32 mmol/L   O2 Saturation 72 %   Acid-base deficit 2.0 0.0 - 2.0 mmol/L   Sodium 135 135 - 145 mmol/L   Potassium 4.7 3.5 - 5.1 mmol/L   Calcium, Ion 1.20 1.15 - 1.40 mmol/L   HCT 43.0 39.0 - 52.0 %   Hemoglobin 14.6 13.0 - 17.0 g/dL   Patient temperature 01.2 F    Collection site Femoral    Drawn by HIDE    Sample type VENOUS   CBG monitoring, ED     Status: Abnormal   Collection Time: 12/05/23  1:43 PM  Result Value Ref Range   Glucose-Capillary 352 (H) 70 - 99 mg/dL    Comment: Glucose reference range applies only to samples taken after fasting for at least 8 hours.  Basic metabolic panel     Status: Abnormal   Collection Time: 12/05/23  1:49 PM  Result Value Ref Range   Sodium 139 135 - 145 mmol/L    Comment: Delta check noted    Potassium 4.1 3.5 -  5.1 mmol/L   Chloride 106 98 - 111 mmol/L   CO2 20 (L) 22 - 32 mmol/L   Glucose, Bld 298 (H) 70 - 99 mg/dL    Comment: Glucose reference range applies only to samples taken after fasting for at least 8 hours.   BUN 13 6 - 20 mg/dL   Creatinine, Ser 9.12 0.61 - 1.24 mg/dL   Calcium 8.8 (L) 8.9 - 10.3 mg/dL   GFR, Estimated >39 >39 mL/min    Comment: (NOTE) Calculated using the CKD-EPI Creatinine Equation (2021)    Anion gap 13 5 - 15    Comment: Performed at [facility], [address] [place], [place] [address]  CBG monitoring, ED     Status: Abnormal   Collection Time: 12/05/23  2:51 PM  Result Value Ref Range   Glucose-Capillary 180 (H) 70 - 99 mg/dL    Comment: Glucose reference range applies only to samples taken after fasting for at least 8 hours.    EKG: None ordered in the ED so we will obtain one now  Assessment and Plan: No notes have been filed under this hospital service. Service: Hospitalist  Hyperglycemic Crisis; DKA ruled out: Patient was hyperglycemic on admission but did not have a ketoacidosis and his beta-hydroxybutyrate acid was not elevated.  Betahydroxy trend went from 0.14 -> 0.16.  Patient was placed on insulin  drip and is now weaned off and is on long-acting Semglee  15 units and placed on a moderate NovoLog  sliding scale insulin  AC and at bedtime.  Diabetes education coordinator has been consulted for further evaluation.  Checking hemoglobin A1c in the AM.  Anticipated discharging home in next 24 hours  Metabolic Acidosis: Mild.  CO2 is now 18 and anion gap is 14, with a chloride level 108.  Continue to monitor for and trend and repeat CMP in the a.m.  Generalized weakness, blurry vision, polyuria, blurry Polyphasia in the Setting of Hyperglycemic Crisis: Had bodyaches, tingling in his legs which have now all improved.  Continue to control blood sugars.  Anticipating discharge in next 24 hours.  Worked up for infection and COVID and respiratory panel negative.  Urinalysis negative but did show glucosuria.  Strep screen was negative and chest x-ray showed no acute disease.  PseudoHyponatremia: Na+ was 132 on Admission but corrected for his presenting glucose of 551 it was corrected to 139 Shaune) or 143 Daralene). CTM and Trend and repeat CMP in the AM  Elevated ALT: Mild and likely reactive.  Continue monitor and trend and repeat CMP in a.m. and if not improving or worsening will consider right upper quadrant ultrasound and acute hepatitis panel.  Overweight: Complicates overall prognosis and care. Estimated body mass index is 28.27 kg/m as calculated from the following:   Height as of this encounter: 5' 4 (1.626 m).   Weight as of this encounter: 74.7 kg. Weight Loss and Dietary Counseling given  Advance Care  Planning:   Code Status: Not on file FULL CODE  Consults: None  Family Communication: No family currently at bedside  Severity of Illness: The appropriate patient status for this patient is OBSERVATION. Observation status is judged to be reasonable and necessary in order to provide the required intensity of service to ensure the patient's safety. The patient's presenting symptoms, physical exam findings, and initial radiographic and laboratory data in the context of their medical condition is felt to place them at decreased risk for further clinical deterioration. Furthermore, it is anticipated that the  patient will be medically stable for discharge from the hospital within 2 midnights of admission.   Author: Alejandro Marker, DO [facility] 12/05/2023 4:47 PM  For on call review [email or web address].

## 2023-12-05 NOTE — ED Notes (Signed)
 ED Provider at bedside.

## 2023-12-05 NOTE — ED Notes (Signed)
 Erin, PA made aware of glucose results.

## 2023-12-05 NOTE — ED Notes (Signed)
 Patient is being discharged from the Urgent Care and sent to the Emergency Department via private vehicle . Per Rocky Mecum PA, patient is in need of higher level of care due to CBG, reported dizziness, unsteady gait. Patient is aware and verbalizes understanding of plan of care.   Vitals:   12/05/23 0828  BP: 119/85  Pulse: 82  Resp: 17  Temp: 97.6 F (36.4 C)  SpO2: 96%

## 2023-12-05 NOTE — ED Triage Notes (Signed)
 Pt presents with a chief complaint of dizziness today. Dizziness is accompanied with generalized body aches, fatigue, and lightheadedness. States COVID is going around amongst his coworkers. Also mentions the possibility of food poisoning on Thursday 9/11 after eating at a chinese buffet. Currently rates overall pain a 5/10. Having pain from neck all the way down to legs.

## 2023-12-05 NOTE — ED Notes (Signed)
[facility] on unit for transport  

## 2023-12-05 NOTE — Plan of Care (Signed)

## 2023-12-05 NOTE — ED Notes (Signed)
 VBG results  Temp 37 C pH 7.315 PCO2 47.6 mmHg PO2 42 mmHg BE,B -2 mmol/L HCO3 24.2 mmol/L TCO2 26 mmol/L sO2 72%  Na 135 mmol/L  K 4.7 mmol/L Ca 1.20 mmol/L  Hct, CPB 43% PCV Hb, CBP 14.6 g/dL

## 2023-12-05 NOTE — Discharge Instructions (Signed)
 You are seen today for concerns of extreme dizziness, fatigue as well as body aches and tingling in your legs.  Your testing here was negative for COVID, flu, strep but your fingerstick glucose was 549.  Based on your symptoms I am concerned that you might be having a complication with your diabetes such as diabetic ketoacidosis  (DKA) or HHS which can cause significant complications and need to be managed in the emergency room.  We have provided you with the addresses for the local emergency room's and I recommend going to the closest 1 immediately.  You have declined transportation by EMS and have decided to have your companion take you but if you feel like you cannot make it to the emergency room

## 2023-12-05 NOTE — ED Provider Notes (Signed)
 [facility]    CSN: [id number] Arrival date & time: 12/05/23  9185      History   Chief Complaint Chief Complaint  Patient presents with   Dizziness    Lightheadedness, feels like he cannot stand up. COVID going around among coworkers. Having generalized body aches, fatigue.     HPI James Brock is a 53 y.o. male.  has a past medical history of Anxiety, Diabetes mellitus without complication (HCC), and High cholesterol.   HPI  Discussed the use of AI scribe software for clinical note transcription with the patient, who gave verbal consent to proceed.  The patient presents with inability to sit up straight, body aches, and tingling in the legs.  He experiences an inability to sit up straight, generalized body aches, and tingling in the legs, which began this morning. He also reports fatigue and visual disturbances, describing his eyesight as 'going out' and blurry. He wears glasses and cannot see well without them.  No fever, chills, nausea, vomiting, diarrhea, or abdominal pain. He reports coughing with mucus production, which he attributes to smoking. No nasal congestion or rhinorrhea is present.  He mentions dizziness, which may worsen with head movement or bending over, and feels wobbly when standing. He has experienced similar symptoms in the past, possibly related to mold exposure   He has been exposed to people with COVID recently but has been able to eat and drink today.    Past Medical History:  Diagnosis Date   Anxiety    Diabetes mellitus without complication (HCC)    High cholesterol     There are no active problems to display for this patient.   History reviewed. No pertinent surgical history.     Home Medications    Prior to Admission medications   Medication Sig Start Date End Date Taking? Authorizing Provider  albuterol  (VENTOLIN  HFA) 108 (90 Base) MCG/ACT inhaler Inhale 1-2 puffs into the lungs every 6 (six) hours as needed for  wheezing or shortness of breath. 10/18/23   Tomekia Helton E, PA-C  ALPRAZolam (XANAX) 0.5 MG tablet Take 0.5 mg by mouth at bedtime as needed for anxiety.    [provider]  amoxicillin -clavulanate (AUGMENTIN ) 875-125 MG tablet Take 1 tablet by mouth every 12 (twelve) hours. 11/01/23   Ej Pinson E, PA-C  atorvastatin (LIPITOR) 40 MG tablet Take 40 mg by mouth daily.    [provider]  azithromycin  (ZITHROMAX ) 250 MG tablet Take 1 tablet (250 mg total) by mouth daily. Take first 2 tablets together, then 1 every day until finished. 10/20/23   Enedelia Dorna HERO, FNP  benzonatate  (TESSALON ) 100 MG capsule Take 1 capsule (100 mg total) by mouth every 8 (eight) hours. 10/18/23   Genesys Coggeshall E, PA-C  busPIRone (BUSPAR) 10 MG tablet Take 10 mg by mouth 2 (two) times daily.    [provider]  DULoxetine (CYMBALTA) 60 MG capsule Take 60 mg by mouth daily. 05/06/22   [provider]  fexofenadine (ALLEGRA) 180 MG tablet Take 180 mg by mouth daily.    [provider]  GLYBURIDE-METFORMIN  PO Take by mouth.    [provider]  Guaifenesin  1200 MG TB12 Take 1 tablet (1,200 mg total) by mouth in the morning and at bedtime. 10/20/23   Enedelia Dorna HERO, FNP  methylPREDNISolone  (MEDROL  DOSEPAK) 4 MG TBPK tablet Take per instructions on package. 11/01/23   Vaun Hyndman E, PA-C  OZEMPIC, 2 MG/DOSE, 8 MG/3ML SOPN Inject into the skin.  [provider]    Family History History reviewed. No pertinent family history.  Social History Social History   Tobacco Use   Smoking status: Some Days    Types: Cigarettes   Smokeless tobacco: Never  Vaping Use   Vaping status: Never Used  Substance Use Topics   Alcohol use: Never   Drug use: Never     Allergies   Patient has no known allergies.   Review of Systems Review of Systems  Constitutional:  Positive for fatigue. Negative for chills and fever.  HENT:  Negative for congestion and  rhinorrhea.   Eyes:  Positive for visual disturbance.  Respiratory:  Positive for cough (productive). Negative for shortness of breath and wheezing.   Gastrointestinal:  Negative for abdominal pain, diarrhea, nausea and vomiting.  Musculoskeletal:  Positive for myalgias.  Neurological:  Positive for light-headedness, numbness and headaches.     Physical Exam Triage Vital Signs ED Triage Vitals  Encounter Vitals Group     BP 12/05/23 0828 119/85     Girls Systolic BP Percentile --      Girls Diastolic BP Percentile --      Boys Systolic BP Percentile --      Boys Diastolic BP Percentile --      Pulse Rate 12/05/23 0828 82     Resp 12/05/23 0828 17     Temp 12/05/23 0828 97.6 F (36.4 C)     Temp Source 12/05/23 0828 Oral     SpO2 12/05/23 0828 96 %     Weight 12/05/23 0831 175 lb 0.7 oz (79.4 kg)     Height 12/05/23 0831 5' 4 (1.626 m)     Head Circumference --      Peak Flow --      Pain Score 12/05/23 0830 5     Pain Loc --      Pain Education --      Exclude from Growth Chart --    No data found.  Updated Vital Signs BP 119/85 (BP Location: Right Arm)   Pulse 82   Temp 97.6 F (36.4 C) (Oral)   Resp 17   Ht 5' 4 (1.626 m)   Wt 175 lb 0.7 oz (79.4 kg)   SpO2 96%   BMI 30.05 kg/m   Visual Acuity Right Eye Distance:   Left Eye Distance:   Bilateral Distance:    Right Eye Near:   Left Eye Near:    Bilateral Near:     Physical Exam Vitals reviewed.  Constitutional:      General: He is awake.     Appearance: Normal appearance. He is well-developed and well-groomed. He is ill-appearing. He is not toxic-appearing or diaphoretic.  HENT:     Head: Normocephalic and atraumatic.     Right Ear: Hearing and ear canal normal. Tympanic membrane is erythematous and retracted.     Left Ear: Hearing and ear canal normal. Tympanic membrane is erythematous and retracted.     Ears:     Comments: Significant retractions present for bilateral tympanic membranes as well  as erythema    Mouth/Throat:     Lips: Pink.     Mouth: Mucous membranes are moist.     Pharynx: Oropharynx is clear. Uvula midline. No pharyngeal swelling, oropharyngeal exudate, posterior oropharyngeal erythema, uvula swelling or postnasal drip.     Tonsils: No tonsillar exudate or tonsillar abscesses.  Eyes:     General: Lids are normal. Gaze aligned appropriately.  Extraocular Movements: Extraocular movements intact.  Cardiovascular:     Rate and Rhythm: Normal rate and regular rhythm.     Heart sounds: Normal heart sounds. No murmur heard.    No friction rub. No gallop.  Pulmonary:     Effort: Pulmonary effort is normal.     Breath sounds: Decreased air movement present. Examination of the right-lower field reveals decreased breath sounds. Decreased breath sounds present. No wheezing, rhonchi or rales.  Musculoskeletal:     Cervical back: Normal range of motion and neck supple.  Lymphadenopathy:     Head:     Right side of head: No submental, submandibular or preauricular adenopathy.     Left side of head: No submental, submandibular or preauricular adenopathy.     Cervical:     Right cervical: No superficial cervical adenopathy.    Left cervical: No superficial cervical adenopathy.     Upper Body:     Right upper body: No supraclavicular adenopathy.     Left upper body: No supraclavicular adenopathy.  Skin:    General: Skin is warm and dry.     Coloration: Skin is pale.  Neurological:     General: No focal deficit present.     Mental Status: He is oriented to person, place, and time. He is lethargic.  Psychiatric:        Attention and Perception: He is inattentive.        Mood and Affect: Mood normal.        Speech: Speech is slurred.        Behavior: Behavior normal. Behavior is cooperative.      UC Treatments / Results  Labs (all labs ordered are listed, but only abnormal results are displayed) Labs Reviewed  GLUCOSE, POCT (MANUAL RESULT ENTRY) - Abnormal;  Notable for the following components:      Result Value   POC Glucose 549 (*)    All other components within normal limits  POC COVID19/FLU A&B COMBO  POCT RAPID STREP A (OFFICE)    EKG   Radiology No results found.  Procedures Procedures (including critical care time)  Medications Ordered in UC Medications - No data to display  Initial Impression / Assessment and Plan / UC Course  I have reviewed the triage vital signs and the nursing notes.  Pertinent labs & imaging results that were available during my care of the patient were reviewed by me and considered in my medical decision making (see chart for details).      Final Clinical Impressions(s) / UC Diagnoses   Final diagnoses:  Symptoms of upper respiratory infection (URI)  Dizziness and giddiness  Type 2 diabetes mellitus without complication, unspecified whether long term insulin  use (HCC)   Patient presents today with concerns for persistent dizziness, lethargy and fatigue, productive coughing, body aches that has been ongoing since this morning.  He also reports tingling in the legs as well as vision changes.  Physical exam is notable for mildly decreased air movement as well as decreased breath sounds in the right lower lung field.  Rapid COVID, flu, strep test were negative.  Point-of-care CBG was 549.  Patient has a history of poorly controlled type 2 diabetes per chart review, last A1c was 10.2% per labs done in February 2025.  Of note patient has been seen at this urgent care several times in August for upper respiratory illness but he states that current symptoms are not directly related to those illnesses.  Based on his past  medical history as well as recent illness I am concerned for potential diabetes complication or exacerbation especially in the sense of poor control at baseline.  Given his extreme fatigue and lethargy in the clinic I am concerned for potential DKA or HHS and recommend prompt evaluation in the  emergency room for evaluation and stabilization.  Patient is amenable to going to the ED but declines EMS transportation.  He does have a companion who is willing and able to take him to the ED via private vehicle.  Closest emergency room addresses and wait times were provided as well as precautions if they are unable to make it and need assistance from EMS.    Discharge Instructions      You are seen today for concerns of extreme dizziness, fatigue as well as body aches and tingling in your legs.  Your testing here was negative for COVID, flu, strep but your fingerstick glucose was 549.  Based on your symptoms I am concerned that you might be having a complication with your diabetes such as diabetic ketoacidosis  (DKA) or HHS which can cause significant complications and need to be managed in the emergency room.  We have provided you with the addresses for the local emergency room's and I recommend going to the closest 1 immediately.  You have declined transportation by EMS and have decided to have your companion take you but if you feel like you cannot make it to the emergency room     ED Prescriptions   None    PDMP not reviewed this encounter.   Damere Brandenburg, [place] BRAVO, PA-C 12/05/23 1008

## 2023-12-05 NOTE — ED Triage Notes (Signed)
 Pt sent by UC. Complaining of dizziness, fatigue and lightheaded . Symptoms worse today. Fatigue for weeks. Hands tingling. Increase urination. Takes metformin  for DM

## 2023-12-06 ENCOUNTER — Other Ambulatory Visit (HOSPITAL_COMMUNITY): Payer: Self-pay

## 2023-12-06 ENCOUNTER — Telehealth (HOSPITAL_COMMUNITY): Payer: Self-pay | Admitting: Pharmacy Technician

## 2023-12-06 DIAGNOSIS — E1110 Type 2 diabetes mellitus with ketoacidosis without coma: Secondary | ICD-10-CM | POA: Diagnosis not present

## 2023-12-06 LAB — GLUCOSE, CAPILLARY
Glucose-Capillary: 178 mg/dL — ABNORMAL HIGH (ref 70–99)
Glucose-Capillary: 261 mg/dL — ABNORMAL HIGH (ref 70–99)
Glucose-Capillary: 312 mg/dL — ABNORMAL HIGH (ref 70–99)

## 2023-12-06 LAB — CBC WITH DIFFERENTIAL/PLATELET
Abs Immature Granulocytes: 0.07 K/uL (ref 0.00–0.07)
Basophils Absolute: 0 K/uL (ref 0.0–0.1)
Basophils Relative: 0 %
Eosinophils Absolute: 0.3 K/uL (ref 0.0–0.5)
Eosinophils Relative: 5 %
HCT: 44 % (ref 39.0–52.0)
Hemoglobin: 14.4 g/dL (ref 13.0–17.0)
Immature Granulocytes: 1 %
Lymphocytes Relative: 28 %
Lymphs Abs: 2 K/uL (ref 0.7–4.0)
MCH: 28.7 pg (ref 26.0–34.0)
MCHC: 32.7 g/dL (ref 30.0–36.0)
MCV: 87.6 fL (ref 80.0–100.0)
Monocytes Absolute: 0.8 K/uL (ref 0.1–1.0)
Monocytes Relative: 11 %
Neutro Abs: 4 K/uL (ref 1.7–7.7)
Neutrophils Relative %: 55 %
Platelets: 254 K/uL (ref 150–400)
RBC: 5.02 MIL/uL (ref 4.22–5.81)
RDW: 12.5 % (ref 11.5–15.5)
WBC: 7.3 K/uL (ref 4.0–10.5)
nRBC: 0 % (ref 0.0–0.2)

## 2023-12-06 LAB — COMPREHENSIVE METABOLIC PANEL WITH GFR
ALT: 45 U/L — ABNORMAL HIGH (ref 0–44)
AST: 28 U/L (ref 15–41)
Albumin: 3.6 g/dL (ref 3.5–5.0)
Alkaline Phosphatase: 81 U/L (ref 38–126)
Anion gap: 12 (ref 5–15)
BUN: 19 mg/dL (ref 6–20)
CO2: 21 mmol/L — ABNORMAL LOW (ref 22–32)
Calcium: 8.5 mg/dL — ABNORMAL LOW (ref 8.9–10.3)
Chloride: 106 mmol/L (ref 98–111)
Creatinine, Ser: 0.9 mg/dL (ref 0.61–1.24)
GFR, Estimated: >60 mL/min (ref >60)
Glucose, Bld: 164 mg/dL — ABNORMAL HIGH (ref 70–99)
Potassium: 3.8 mmol/L (ref 3.5–5.1)
Sodium: 139 mmol/L (ref 135–145)
Total Bilirubin: 0.5 mg/dL (ref 0.0–1.2)
Total Protein: 5.7 g/dL — ABNORMAL LOW (ref 6.5–8.1)

## 2023-12-06 LAB — PHOSPHORUS: Phosphorus: 3.3 mg/dL (ref 2.5–4.6)

## 2023-12-06 LAB — HEMOGLOBIN A1C
Hgb A1c MFr Bld: 10.4 % — ABNORMAL HIGH (ref 4.8–5.6)
Mean Plasma Glucose: 251.78 mg/dL

## 2023-12-06 LAB — MAGNESIUM: Magnesium: 1.9 mg/dL (ref 1.7–2.4)

## 2023-12-06 MED ORDER — LANCET DEVICE MISC: 1.0000 | Freq: Three times a day (TID) | 0 refills | Status: AC

## 2023-12-06 MED ORDER — INSULIN STARTER KIT- PEN NEEDLES (ENGLISH)
1.0000 | Freq: Once | Status: AC
Start: 2023-12-06 — End: 2023-12-06
  Administered 2023-12-06: 1
  Filled 2023-12-06: qty 1

## 2023-12-06 MED ORDER — LANCETS MISC: 1.0000 | Freq: Three times a day (TID) | 0 refills | Status: AC

## 2023-12-06 MED ORDER — INSULIN GLARGINE 100 UNIT/ML SOLOSTAR PEN
15.0000 [IU] | PEN_INJECTOR | Freq: Every day | SUBCUTANEOUS | 0 refills | Status: AC
  Filled 2023-12-07: qty 15, 100d supply, fill #0

## 2023-12-06 MED ORDER — INSULIN ASPART 100 UNIT/ML IJ SOLN
7.0000 [IU] | Freq: Once | INTRAMUSCULAR | Status: AC
  Administered 2023-12-06: 7 [IU] via SUBCUTANEOUS

## 2023-12-06 MED ORDER — METFORMIN HCL 500 MG PO TABS: 500.0000 mg | ORAL_TABLET | Freq: Two times a day (BID) | ORAL | 0 refills | Status: AC

## 2023-12-06 MED ORDER — LANCETS MISC. MISC: 1.0000 | Freq: Three times a day (TID) | 0 refills | Status: AC

## 2023-12-06 MED ORDER — BLOOD GLUCOSE MONITORING SUPPL DEVI: 1.0000 | Freq: Three times a day (TID) | 0 refills | Status: AC

## 2023-12-06 MED ORDER — PEN NEEDLES 31G X 5 MM MISC: 1.0000 | Freq: Three times a day (TID) | 0 refills | Status: AC

## 2023-12-06 MED ORDER — LIVING WELL WITH DIABETES BOOK
Freq: Once | Status: AC
  Filled 2023-12-06: qty 1

## 2023-12-06 MED ORDER — BLOOD GLUCOSE TEST VI STRP: 1.0000 | ORAL_STRIP | Freq: Three times a day (TID) | 0 refills | Status: AC

## 2023-12-06 MED ORDER — BLOOD GLUCOSE TEST VI STRP
1.0000 | ORAL_STRIP | Freq: Three times a day (TID) | 0 refills | Status: AC
Start: 2023-12-06 — End: 2024-01-05

## 2023-12-06 MED ORDER — GLUCAGON EMERGENCY 1 MG IJ KIT: 1.0000 mg | PACK | INTRAMUSCULAR | 0 refills | Status: AC | PRN

## 2023-12-06 NOTE — Discharge Summary (Signed)
 Physician Discharge Summary  James Brock FMW:[id number] DOB: 02-Dec-1970 DOA: 12/05/2023  PCP: Hyacinth Honey, NP  Admit date: 12/05/2023 Discharge date: 12/06/2023  Admitted From: Home Disposition: Home  Recommendations for Outpatient Follow-up:  Follow up with PCP in 1-2 weeks  Home Health: None Equipment/Devices: New diabetes kit  Discharge Condition: Stable CODE STATUS: Full Diet recommendation: Diabetic diet  Brief/Interim Summary: Patient is a pleasant 53 year old male with known history of anxiety, diabetes type 2 controlled with diet and p.o. medications, hyperlipidemia who presents with complaints of generalized weakness aches and paresthesias.  Patient found to be in DKA with elevated gap and hyperglycemia.  Patient admitted as above with DKA, improved drastically with IV fluids insulin , A1c level returned at 10.4 which patient indicates is an improvement from prior but given profoundly elevated A1c, hyperglycemia and symptoms patient will transition to insulin , will transition patient's p.o. regimen to metformin  only in the interim.  Recommend close follow-up with PCP in the next 1 to 2 weeks, home glucose testing kit ordered, long-acting insulin  pen ordered as well.  Recommend close monitoring of glucose and report to PCP if any ongoing hyperglycemia.  Discussed the risks of hypoglycemia as well indicated patient should return to hospital for any symptomatic hypoglycemia.  Discharge Diagnoses:  Principal Problem:   DKA (diabetic ketoacidosis) (HCC)  DKA, resolved Notable anion gap without ketones although urine quite dilute -Resolved with insulin  drip, transition to long-acting insulin , tolerating p.o. otherwise stable and agreeable for discharge home. A1c elevated at 10.4, noted to be an improvement from prior - Generalized weakness, blurry vision, polyuria all resolved - Infectious workup negative  PseudoHyponatremia:  - Lab reported sodium at 132 but corrected to  139/143 based on glucose readings, resolved   Elevated ALT:  Likely reactive, follow-up outpatient for repeat labs once acute illness is resolved as above.   Overweight: Complicates overall prognosis and care. Estimated body mass index is 28.27 kg/m as calculated from the following:   Height as of this encounter: 5' 4 (1.626 m).   Weight as of this encounter: 74.7 kg. Weight Loss and Dietary Counseling given  Discharge Instructions  Discharge Instructions     Amb Referral to Nutrition and Diabetic Education   Complete by: As directed    Call MD for:  difficulty breathing, headache or visual disturbances   Complete by: As directed    Call MD for:  extreme fatigue   Complete by: As directed    Call MD for:  hives   Complete by: As directed    Call MD for:  persistant dizziness or light-headedness   Complete by: As directed    Call MD for:  persistant nausea and vomiting   Complete by: As directed    Call MD for:  severe uncontrolled pain   Complete by: As directed    Call MD for:  temperature >100.4   Complete by: As directed    Diet - low sodium heart healthy   Complete by: As directed    Increase activity slowly   Complete by: As directed       Allergies as of 12/06/2023   No Known Allergies      Medication List     STOP taking these medications    glyBURIDE-metformin  5-500 MG tablet Commonly known as: GLUCOVANCE       TAKE these medications    albuterol  108 (90 Base) MCG/ACT inhaler Commonly known as: VENTOLIN  HFA Inhale 1-2 puffs into the lungs every 6 (six) hours as needed  for wheezing or shortness of breath.   ALPRAZolam 1 MG tablet Commonly known as: XANAX Take 1 mg by mouth daily as needed for anxiety.   atorvastatin 40 MG tablet Commonly known as: LIPITOR Take 40 mg by mouth daily.   Blood Glucose Monitoring Suppl Devi 1 each by Does not apply route in the morning, at noon, and at bedtime. May substitute to any manufacturer covered by  patient's insurance.   BLOOD GLUCOSE TEST STRIPS Strp 1 each by In Vitro route in the morning, at noon, and at bedtime. May substitute to any manufacturer covered by patient's insurance.   BLOOD GLUCOSE TEST STRIPS Strp 1 each by Does not apply route 3 (three) times daily. Use as directed to check blood sugar. May dispense any manufacturer covered by patient's insurance and fits patient's device.   busPIRone 10 MG tablet Commonly known as: BUSPAR Take 20 mg by mouth daily.   DULoxetine 60 MG capsule Commonly known as: CYMBALTA Take 60 mg by mouth daily.   fexofenadine 180 MG tablet Commonly known as: ALLEGRA Take 180 mg by mouth daily.   Glucagon  Emergency 1 MG Kit Inject 1 mg into the skin as needed for up to 2 doses (Severe low blood sugar).   Guaifenesin  1200 MG Tb12 Take 1 tablet (1,200 mg total) by mouth in the morning and at bedtime. What changed:  when to take this reasons to take this   insulin  glargine 100 UNIT/ML Solostar Pen Commonly known as: LANTUS  Inject 15 Units into the skin daily. May substitute as needed per insurance.   Lancet Device Misc 1 each by Does not apply route in the morning, at noon, and at bedtime. May substitute to any manufacturer covered by patient's insurance.   Lancets Misc 1 each by Does not apply route 3 (three) times daily. Use as directed to check blood sugar. May dispense any manufacturer covered by patient's insurance and fits patient's device.   Lancets Misc. Misc 1 each by Does not apply route in the morning, at noon, and at bedtime. May substitute to any manufacturer covered by patient's insurance.   metFORMIN  500 MG tablet Commonly known as: GLUCOPHAGE  Take 1 tablet (500 mg total) by mouth 2 (two) times daily with a meal.   Pen Needles 31G X 5 MM Misc 1 each by Does not apply route 3 (three) times daily. May dispense any manufacturer covered by patient's insurance.        No Known  Allergies  Consultations: None   Procedures/Studies: DG Chest Port 1 View Result Date: 12/05/2023 CLINICAL DATA:  Cough. EXAM: PORTABLE CHEST 1 VIEW COMPARISON:  10/20/2023 FINDINGS: The lungs are clear without focal pneumonia, edema, pneumothorax or pleural effusion. The cardiopericardial silhouette is within normal limits for size. No acute bony abnormality. IMPRESSION: No active disease. Electronically Signed   By: Camellia Candle M.D.   On: 12/05/2023 11:32     Subjective: No acute issues or events overnight denies nausea vomiting diarrhea constipation headache fevers chills or chest pain   Discharge Exam: Vitals:   12/06/23 0800 12/06/23 1200  BP: (!) 118/93   Pulse: 66   Resp: 14   Temp:  98.1 F (36.7 C)  SpO2: 98%    Vitals:   12/06/23 0700 12/06/23 0749 12/06/23 0800 12/06/23 1200  BP:   (!) 118/93   Pulse: 62 71 66   Resp: 16 18 14    Temp:    98.1 F (36.7 C)  TempSrc:    Oral  SpO2: 98% 100% 98%   Weight:      Height:        General: Pt is alert, awake, not in acute distress Cardiovascular: RRR, S1/S2 +, no rubs, no gallops Respiratory: CTA bilaterally, no wheezing, no rhonchi Abdominal: Soft, NT, ND, bowel sounds + Extremities: no edema, no cyanosis    The results of significant diagnostics from this hospitalization (including imaging, microbiology, ancillary and laboratory) are listed below for reference.     Microbiology: Recent Results (from the past 240 hours)  Resp panel by RT-PCR (RSV, Flu A&B, Covid) Anterior Nasal Swab     Status: None   Collection Time: 12/05/23 10:47 AM   Specimen: Anterior Nasal Swab  Result Value Ref Range Status   SARS Coronavirus 2 by RT PCR NEGATIVE NEGATIVE Final    Comment: (NOTE) SARS-CoV-2 target nucleic acids are NOT DETECTED.  The SARS-CoV-2 RNA is generally detectable in upper respiratory specimens during the acute phase of infection. The lowest concentration of SARS-CoV-2 viral copies this assay can detect  is 138 copies/mL. A negative result does not preclude SARS-Cov-2 infection and should not be used as the sole basis for treatment or other patient management decisions. A negative result may occur with  improper specimen collection/handling, submission of specimen other than nasopharyngeal swab, presence of viral mutation(s) within the areas targeted by this assay, and inadequate number of viral copies(<138 copies/mL). A negative result must be combined with clinical observations, patient history, and epidemiological information. The expected result is Negative.  Fact Sheet for Patients:  BloggerCourse.com  Fact Sheet for Healthcare Providers:  SeriousBroker.it  This test is no t yet approved or cleared by the [place]  FDA and  has been authorized for detection and/or diagnosis of SARS-CoV-2 by FDA under an Emergency Use Authorization (EUA). This EUA will remain  in effect (meaning this test can be used) for the duration of the COVID-19 declaration under Section 564(b)(1) of the Act, 21 U.S.C.section 360bbb-3(b)(1), unless the authorization is terminated  or revoked sooner.       Influenza A by PCR NEGATIVE NEGATIVE Final   Influenza B by PCR NEGATIVE NEGATIVE Final    Comment: (NOTE) The Xpert Xpress SARS-CoV-2/FLU/RSV plus assay is intended as an aid in the diagnosis of influenza from Nasopharyngeal swab specimens and should not be used as a sole basis for treatment. Nasal washings and aspirates are unacceptable for Xpert Xpress SARS-CoV-2/FLU/RSV testing.  Fact Sheet for Patients: BloggerCourse.com  Fact Sheet for Healthcare Providers: SeriousBroker.it  This test is not yet approved or cleared by the [place]  FDA and has been authorized for detection and/or diagnosis of SARS-CoV-2 by FDA under an Emergency Use Authorization (EUA). This EUA will remain in effect  (meaning this test can be used) for the duration of the COVID-19 declaration under Section 564(b)(1) of the Act, 21 U.S.C. section 360bbb-3(b)(1), unless the authorization is terminated or revoked.     Resp Syncytial Virus by PCR NEGATIVE NEGATIVE Final    Comment: (NOTE) Fact Sheet for Patients: BloggerCourse.com  Fact Sheet for Healthcare Providers: SeriousBroker.it  This test is not yet approved or cleared by the [place]  FDA and has been authorized for detection and/or diagnosis of SARS-CoV-2 by FDA under an Emergency Use Authorization (EUA). This EUA will remain in effect (meaning this test can be used) for the duration of the COVID-19 declaration under Section 564(b)(1) of the Act, 21 U.S.C. section 360bbb-3(b)(1), unless the authorization is terminated or revoked.  Performed at [facility]  [employer], [address] [place], [place] [address]      Labs: BNP (last 3 results) No results for input(s): BNP in the last 8760 hours. Basic Metabolic Panel: Recent Labs  Lab 12/05/23 1047 12/05/23 1256 12/05/23 1349 12/05/23 1649 12/05/23 2116 12/06/23 0325  NA 132* 135 139 140 140 139  K 4.6 4.7 4.1 4.2 4.0 3.8  CL 96*  --  106 108 106 106  CO2 19*  --  20* 18* 22 21*  GLUCOSE 551*  --  298* 133* 218* 164*  BUN 16  --  13 12 16 19   CREATININE 0.95  --  0.87 0.81 1.21 0.90  CALCIUM 9.5  --  8.8* 9.0 8.6* 8.5*  MG  --   --   --  2.3  --  1.9  PHOS  --   --   --  3.0  --  3.3   Liver Function Tests: Recent Labs  Lab 12/05/23 1649 12/06/23 0325  AST 39 28  ALT 57* 45*  ALKPHOS 100 81  BILITOT 0.4 0.5  PROT 6.5 5.7*  ALBUMIN 4.0 3.6   No results for input(s): LIPASE, AMYLASE in the last 168 hours. No results for input(s): AMMONIA in the last 168 hours. CBC: Recent Labs  Lab 12/05/23 1047 12/05/23 1256 12/05/23 1649 12/06/23 0325  WBC 7.8  --  6.7 7.3  NEUTROABS 5.4  --  4.0 4.0  HGB 15.9  14.6 14.7 14.4  HCT 44.7 43.0 44.5 44.0  MCV 85.0  --  87.3 87.6  PLT 274  --  279 254   Cardiac Enzymes: No results for input(s): CKTOTAL, CKMB, CKMBINDEX, TROPONINI in the last 168 hours. BNP: Invalid input(s): POCBNP CBG: Recent Labs  Lab 12/05/23 1752 12/05/23 1851 12/05/23 2029 12/06/23 0737 12/06/23 1208  GLUCAP 154* 166* 236* 178* 261*   D-Dimer No results for input(s): DDIMER in the last 72 hours. Hgb A1c Recent Labs    12/06/23 0325  HGBA1C 10.4*   Lipid Profile No results for input(s): CHOL, HDL, LDLCALC, TRIG, CHOLHDL, LDLDIRECT in the last 72 hours. Thyroid function studies No results for input(s): TSH, T4TOTAL, T3FREE, THYROIDAB in the last 72 hours.  Invalid input(s): FREET3 Anemia work up No results for input(s): VITAMINB12, FOLATE, FERRITIN, TIBC, IRON, RETICCTPCT in the last 72 hours. Urinalysis    Component Value Date/Time   COLORURINE YELLOW 12/05/2023 1047   APPEARANCEUR CLEAR 12/05/2023 1047   LABSPEC 1.010 12/05/2023 1047   PHURINE 5.5 12/05/2023 1047   GLUCOSEU >=500 (A) 12/05/2023 1047   HGBUR NEGATIVE 12/05/2023 1047   BILIRUBINUR NEGATIVE 12/05/2023 1047   KETONESUR NEGATIVE 12/05/2023 1047   PROTEINUR NEGATIVE 12/05/2023 1047   NITRITE NEGATIVE 12/05/2023 1047   LEUKOCYTESUR NEGATIVE 12/05/2023 1047   Sepsis Labs Recent Labs  Lab 12/05/23 1047 12/05/23 1649 12/06/23 0325  WBC 7.8 6.7 7.3   Microbiology Recent Results (from the past 240 hours)  Resp panel by RT-PCR (RSV, Flu A&B, Covid) Anterior Nasal Swab     Status: None   Collection Time: 12/05/23 10:47 AM   Specimen: Anterior Nasal Swab  Result Value Ref Range Status   SARS Coronavirus 2 by RT PCR NEGATIVE NEGATIVE Final    Comment: (NOTE) SARS-CoV-2 target nucleic acids are NOT DETECTED.  The SARS-CoV-2 RNA is generally detectable in upper respiratory specimens during the acute phase of infection. The lowest concentration  of SARS-CoV-2 viral copies this assay can detect is 138 copies/mL. A negative result does not  preclude SARS-Cov-2 infection and should not be used as the sole basis for treatment or other patient management decisions. A negative result may occur with  improper specimen collection/handling, submission of specimen other than nasopharyngeal swab, presence of viral mutation(s) within the areas targeted by this assay, and inadequate number of viral copies(<138 copies/mL). A negative result must be combined with clinical observations, patient history, and epidemiological information. The expected result is Negative.  Fact Sheet for Patients:  BloggerCourse.com  Fact Sheet for Healthcare Providers:  SeriousBroker.it  This test is no t yet approved or cleared by the [place]  FDA and  has been authorized for detection and/or diagnosis of SARS-CoV-2 by FDA under an Emergency Use Authorization (EUA). This EUA will remain  in effect (meaning this test can be used) for the duration of the COVID-19 declaration under Section 564(b)(1) of the Act, 21 U.S.C.section 360bbb-3(b)(1), unless the authorization is terminated  or revoked sooner.       Influenza A by PCR NEGATIVE NEGATIVE Final   Influenza B by PCR NEGATIVE NEGATIVE Final    Comment: (NOTE) The Xpert Xpress SARS-CoV-2/FLU/RSV plus assay is intended as an aid in the diagnosis of influenza from Nasopharyngeal swab specimens and should not be used as a sole basis for treatment. Nasal washings and aspirates are unacceptable for Xpert Xpress SARS-CoV-2/FLU/RSV testing.  Fact Sheet for Patients: BloggerCourse.com  Fact Sheet for Healthcare Providers: SeriousBroker.it  This test is not yet approved or cleared by the [place]  FDA and has been authorized for detection and/or diagnosis of SARS-CoV-2 by FDA under an Emergency Use  Authorization (EUA). This EUA will remain in effect (meaning this test can be used) for the duration of the COVID-19 declaration under Section 564(b)(1) of the Act, 21 U.S.C. section 360bbb-3(b)(1), unless the authorization is terminated or revoked.     Resp Syncytial Virus by PCR NEGATIVE NEGATIVE Final    Comment: (NOTE) Fact Sheet for Patients: BloggerCourse.com  Fact Sheet for Healthcare Providers: SeriousBroker.it  This test is not yet approved or cleared by the [place]  FDA and has been authorized for detection and/or diagnosis of SARS-CoV-2 by FDA under an Emergency Use Authorization (EUA). This EUA will remain in effect (meaning this test can be used) for the duration of the COVID-19 declaration under Section 564(b)(1) of the Act, 21 U.S.C. section 360bbb-3(b)(1), unless the authorization is terminated or revoked.  Performed at [facility], [address] [place], [place] [address]      Time coordinating discharge: Over 30 minutes  SIGNED:   Elsie JAYSON Montclair, DO [facility] 12/06/2023, 1:00 PM Pager   If 7PM-7AM, please contact night-coverage [email or web address]

## 2023-12-06 NOTE — Progress Notes (Signed)
   12/06/23 1347  TOC Brief Assessment  Insurance and Status Reviewed Advertising copy[employer] / [employer] PPO)  Patient has primary care physician Yes Arlene, Derek, NP)  Home environment has been reviewed Single family home  Prior level of function: Self care  Prior/Current Home Services No current home services  Social Drivers of Health Review SDOH reviewed no interventions necessary  Readmission risk has been reviewed Yes  Transition of care needs no transition of care needs at this time   Pt screened. ICM consulted for PCP/ medication assistance needs. Pt has PCP listed in chart. Due to pt being insured, there is no medication assistance available at this time. There are no HH, DME or SDOH needs. There are no other ICM needs at this time.

## 2023-12-06 NOTE — Inpatient Diabetes Management (Signed)
 Inpatient Diabetes Program Recommendations  AACE/ADA: New Consensus Statement on Inpatient Glycemic Control (2015)  Target Ranges:  Prepandial:   less than 140 mg/dL      Peak postprandial:   less than 180 mg/dL (1-2 hours)      Critically ill patients:  140 - 180 mg/dL   Lab Results  Component Value Date   GLUCAP 178 (H) 12/06/2023   HGBA1C 10.4 (H) 12/06/2023    Review of Glycemic Control  Diabetes history: DM2 Outpatient Diabetes medications: Glyburide 5-Metformin  500 daily, Ozempic 1 mg Qweek Current orders for Inpatient glycemic control: Lantus  15 units every day,  Novolog  0-9 units TID and 0-5 units QHS  Met with patient at bedside.  Reviewed patient's current A1c of 10.4%. Explained what a A1c is and what it measures. Also reviewed goal A1c with patient, importance of good glucose control @ home, and blood sugar goals. He has not taken Ozempic x 2 months due to GI upset.  He does not check his BG.  Does not drink any beverages with sugar.  Educated patient on insulin  pen use at home. Reviewed contents of insulin  flexpen starter kit. Reviewed all steps of insulin  pen including attachment of needle, 2-unit air shot, dialing up dose, giving injection, removing needle, disposal of sharps, storage of unused insulin , disposal of insulin  etc. Patient able to provide successful return demonstration. Also reviewed troubleshooting with insulin  pen. MD to give patient Rxs for insulin  pens and insulin  pen needles.  Educated on The Plate Method, CHO's, portion control, avoiding caloric beverages, CBGs at home fasting and mid afternoon, F/U with PCP every 3 months, bring meter to PCP office, long and short term complications of uncontrolled BG, and importance of exercise. Reviewed hypoglycemia, < 70 mg/dL, signs, symptoms and treatments.  Consider discontinuing Glyburide/Metformin  if starting insulin  and adding just Metformin  500 mg BID.    Discharge Recommendations: Long acting recommendations:  Insulin  Glargine (LANTUS ) Solostar Pen 15 units QD  Short acting recommendations:  Correction coverage ONLY Insulin  aspart (NOVOLOG ) FlexPen  Sensitive Scale.   Supply/Referral recommendations: Glucometer Test strips Lancet device Lancets Pen needles - standard   Use Adult Diabetes Insulin  Treatment Post Discharge order set.  Thank you, Wyvonna Pinal, MSN, CDCES Diabetes Coordinator Inpatient Diabetes Program [phone] (team pager from 8a-5p)

## 2023-12-06 NOTE — Telephone Encounter (Signed)
 Patient Product/process development scientist completed.    The patient is insured through [facility]. Patient has [employer], may use a copay card, and/or apply for patient assistance if available.    Ran test claim for Lantus Pen and the current 30 day co-pay is $0.00.  Ran test claim for Novolog FlexPen and the current 30 day co-pay is $0.00.  This test claim was processed through [facility]- copay amounts may vary at other pharmacies due to pharmacy/plan contracts, or as the patient moves through the different stages of their insurance plan.     Roland Earl, CPHT Pharmacy Technician III Certified Patient Advocate [facility] Patient Advocate Team Direct Number: [phone]  Fax: [phone]

## 2023-12-07 ENCOUNTER — Encounter (HOSPITAL_COMMUNITY): Payer: Self-pay

## 2023-12-07 ENCOUNTER — Other Ambulatory Visit (HOSPITAL_COMMUNITY): Payer: Self-pay
